# Patient Record
Sex: Female | Born: 1965 | Hispanic: Yes | Marital: Married
Health system: Southern US, Community
[De-identification: ages and names within clinical notes are randomized; demographics above are authoritative.]

---

## 2018-04-02 DIAGNOSIS — G8929 Other chronic pain: Secondary | ICD-10-CM | POA: Insufficient documentation

## 2018-04-03 DIAGNOSIS — R030 Elevated blood-pressure reading, without diagnosis of hypertension: Secondary | ICD-10-CM | POA: Insufficient documentation

## 2019-04-07 ENCOUNTER — Other Ambulatory Visit: Payer: Self-pay

## 2019-04-07 ENCOUNTER — Encounter (HOSPITAL_COMMUNITY): Payer: Self-pay | Admitting: Emergency Medicine

## 2019-04-07 ENCOUNTER — Emergency Department (HOSPITAL_COMMUNITY): Payer: BC Managed Care – PPO

## 2019-04-07 ENCOUNTER — Inpatient Hospital Stay (HOSPITAL_COMMUNITY)
Admission: EM | Admit: 2019-04-07 | Discharge: 2019-04-20 | DRG: 208 | Disposition: A | Discharge status: Home Health | Payer: BC Managed Care – PPO | Attending: Internal Medicine | Admitting: Internal Medicine

## 2019-04-07 DIAGNOSIS — Z79899 Other long term (current) drug therapy: Secondary | ICD-10-CM

## 2019-04-07 DIAGNOSIS — Z791 Long term (current) use of non-steroidal anti-inflammatories (NSAID): Secondary | ICD-10-CM

## 2019-04-07 DIAGNOSIS — J1289 Other viral pneumonia: Secondary | ICD-10-CM | POA: Diagnosis present

## 2019-04-07 DIAGNOSIS — J45909 Unspecified asthma, uncomplicated: Secondary | ICD-10-CM | POA: Diagnosis present

## 2019-04-07 DIAGNOSIS — R45851 Suicidal ideations: Secondary | ICD-10-CM | POA: Diagnosis present

## 2019-04-07 DIAGNOSIS — R0602 Shortness of breath: Secondary | ICD-10-CM | POA: Diagnosis present

## 2019-04-07 DIAGNOSIS — B9629 Other Escherichia coli [E. coli] as the cause of diseases classified elsewhere: Secondary | ICD-10-CM | POA: Diagnosis present

## 2019-04-07 DIAGNOSIS — R7303 Prediabetes: Secondary | ICD-10-CM | POA: Diagnosis present

## 2019-04-07 DIAGNOSIS — R739 Hyperglycemia, unspecified: Secondary | ICD-10-CM | POA: Diagnosis not present

## 2019-04-07 DIAGNOSIS — U071 COVID-19: Secondary | ICD-10-CM | POA: Diagnosis present

## 2019-04-07 DIAGNOSIS — J4520 Mild intermittent asthma, uncomplicated: Secondary | ICD-10-CM | POA: Diagnosis not present

## 2019-04-07 DIAGNOSIS — E870 Hyperosmolality and hypernatremia: Secondary | ICD-10-CM | POA: Diagnosis present

## 2019-04-07 DIAGNOSIS — Z978 Presence of other specified devices: Secondary | ICD-10-CM

## 2019-04-07 DIAGNOSIS — E739 Lactose intolerance, unspecified: Secondary | ICD-10-CM | POA: Diagnosis present

## 2019-04-07 DIAGNOSIS — N390 Urinary tract infection, site not specified: Secondary | ICD-10-CM | POA: Diagnosis present

## 2019-04-07 DIAGNOSIS — R440 Auditory hallucinations: Secondary | ICD-10-CM | POA: Diagnosis not present

## 2019-04-07 DIAGNOSIS — R0902 Hypoxemia: Secondary | ICD-10-CM

## 2019-04-07 DIAGNOSIS — J9621 Acute and chronic respiratory failure with hypoxia: Secondary | ICD-10-CM | POA: Diagnosis not present

## 2019-04-07 DIAGNOSIS — N179 Acute kidney failure, unspecified: Secondary | ICD-10-CM | POA: Diagnosis not present

## 2019-04-07 DIAGNOSIS — E876 Hypokalemia: Secondary | ICD-10-CM | POA: Diagnosis not present

## 2019-04-07 DIAGNOSIS — J80 Acute respiratory distress syndrome: Secondary | ICD-10-CM | POA: Diagnosis not present

## 2019-04-07 DIAGNOSIS — J9601 Acute respiratory failure with hypoxia: Secondary | ICD-10-CM | POA: Diagnosis not present

## 2019-04-07 DIAGNOSIS — G92 Toxic encephalopathy: Secondary | ICD-10-CM | POA: Diagnosis present

## 2019-04-07 DIAGNOSIS — J988 Other specified respiratory disorders: Secondary | ICD-10-CM

## 2019-04-07 DIAGNOSIS — G9340 Encephalopathy, unspecified: Secondary | ICD-10-CM | POA: Diagnosis not present

## 2019-04-07 DIAGNOSIS — E860 Dehydration: Secondary | ICD-10-CM | POA: Diagnosis not present

## 2019-04-07 DIAGNOSIS — R4182 Altered mental status, unspecified: Secondary | ICD-10-CM | POA: Diagnosis not present

## 2019-04-07 DIAGNOSIS — Z0189 Encounter for other specified special examinations: Secondary | ICD-10-CM

## 2019-04-07 DIAGNOSIS — F29 Unspecified psychosis not due to a substance or known physiological condition: Secondary | ICD-10-CM | POA: Diagnosis not present

## 2019-04-07 DIAGNOSIS — J069 Acute upper respiratory infection, unspecified: Secondary | ICD-10-CM | POA: Diagnosis not present

## 2019-04-07 LAB — CBC WITH DIFFERENTIAL/PLATELET
Abs Immature Granulocytes: 0.07 10*3/uL (ref 0.00–0.07)
Basophils Absolute: 0 10*3/uL (ref 0.0–0.1)
Basophils Relative: 0 %
Eosinophils Absolute: 0 10*3/uL (ref 0.0–0.5)
Eosinophils Relative: 0 %
HCT: 38.5 % (ref 36.0–46.0)
Hemoglobin: 12.9 g/dL (ref 12.0–15.0)
Immature Granulocytes: 1 %
Lymphocytes Relative: 6 %
Lymphs Abs: 0.5 10*3/uL — ABNORMAL LOW (ref 0.7–4.0)
MCH: 31.3 pg (ref 26.0–34.0)
MCHC: 33.5 g/dL (ref 30.0–36.0)
MCV: 93.4 fL (ref 80.0–100.0)
Monocytes Absolute: 0.2 10*3/uL (ref 0.1–1.0)
Monocytes Relative: 2 %
Neutro Abs: 7.1 10*3/uL (ref 1.7–7.7)
Neutrophils Relative %: 91 %
Platelets: 251 10*3/uL (ref 150–400)
RBC: 4.12 MIL/uL (ref 3.87–5.11)
RDW: 12.7 % (ref 11.5–15.5)
WBC: 7.8 10*3/uL (ref 4.0–10.5)
nRBC: 0 % (ref 0.0–0.2)

## 2019-04-07 LAB — COMPREHENSIVE METABOLIC PANEL
ALT: 40 U/L (ref 0–44)
AST: 50 U/L — ABNORMAL HIGH (ref 15–41)
Albumin: 3.4 g/dL — ABNORMAL LOW (ref 3.5–5.0)
Alkaline Phosphatase: 70 U/L (ref 38–126)
Anion gap: 8 (ref 5–15)
BUN: 6 mg/dL (ref 6–20)
CO2: 24 mmol/L (ref 22–32)
Calcium: 8.6 mg/dL — ABNORMAL LOW (ref 8.9–10.3)
Chloride: 105 mmol/L (ref 98–111)
Creatinine, Ser: 0.59 mg/dL (ref 0.44–1.00)
GFR calc Af Amer: >60 mL/min (ref >60)
GFR calc non Af Amer: >60 mL/min (ref >60)
Glucose, Bld: 141 mg/dL — ABNORMAL HIGH (ref 70–99)
Potassium: 3.2 mmol/L — ABNORMAL LOW (ref 3.5–5.1)
Sodium: 137 mmol/L (ref 135–145)
Total Bilirubin: 0.5 mg/dL (ref 0.3–1.2)
Total Protein: 7.4 g/dL (ref 6.5–8.1)

## 2019-04-07 LAB — D-DIMER, QUANTITATIVE: D-Dimer, Quant: 0.55 ug/mL-FEU — ABNORMAL HIGH (ref 0.00–0.50)

## 2019-04-07 LAB — LACTIC ACID, PLASMA: Lactic Acid, Venous: 1.3 mmol/L (ref 0.5–1.9)

## 2019-04-07 LAB — FERRITIN: Ferritin: 568 ng/mL — ABNORMAL HIGH (ref 11–307)

## 2019-04-07 LAB — SARS CORONAVIRUS 2 BY RT PCR (HOSPITAL ORDER, PERFORMED IN ~~LOC~~ HOSPITAL LAB): SARS Coronavirus 2: POSITIVE — AB

## 2019-04-07 LAB — TRIGLYCERIDES: Triglycerides: 91 mg/dL (ref <150)

## 2019-04-07 LAB — FIBRINOGEN: Fibrinogen: 619 mg/dL — ABNORMAL HIGH (ref 210–475)

## 2019-04-07 LAB — BLOOD GAS, VENOUS

## 2019-04-07 LAB — PROCALCITONIN: Procalcitonin: <0.1 ng/mL

## 2019-04-07 LAB — LACTATE DEHYDROGENASE: LDH: 332 U/L — ABNORMAL HIGH (ref 98–192)

## 2019-04-07 LAB — C-REACTIVE PROTEIN: CRP: 18.5 mg/dL — ABNORMAL HIGH (ref <1.0)

## 2019-04-07 MED ORDER — METHYLPREDNISOLONE SODIUM SUCC 40 MG IJ SOLR
40.0000 mg | Freq: Three times a day (TID) | INTRAMUSCULAR | Status: DC
  Administered 2019-04-07 – 2019-04-11 (×12): 40 mg via INTRAVENOUS
  Filled 2019-04-07 (×14): qty 1

## 2019-04-07 MED ORDER — ONDANSETRON HCL 4 MG/2ML IJ SOLN: 4.0000 mg | Freq: Four times a day (QID) | INTRAMUSCULAR | Status: DC | PRN

## 2019-04-07 MED ORDER — GUAIFENESIN-DM 100-10 MG/5ML PO SYRP
10.0000 mL | ORAL_SOLUTION | ORAL | Status: DC | PRN
  Administered 2019-04-07 – 2019-04-19 (×16): 10 mL via ORAL
  Filled 2019-04-07 (×16): qty 10

## 2019-04-07 MED ORDER — ONDANSETRON HCL 4 MG PO TABS: 4.0000 mg | ORAL_TABLET | Freq: Four times a day (QID) | ORAL | Status: DC | PRN

## 2019-04-07 MED ORDER — ENOXAPARIN SODIUM 40 MG/0.4ML ~~LOC~~ SOLN
40.0000 mg | SUBCUTANEOUS | Status: DC
  Administered 2019-04-07: 40 mg via SUBCUTANEOUS
  Filled 2019-04-07: qty 0.4

## 2019-04-07 MED ORDER — POLYETHYLENE GLYCOL 3350 17 G PO PACK
17.0000 g | PACK | Freq: Every day | ORAL | Status: DC | PRN
  Administered 2019-04-12: 17 g via ORAL
  Filled 2019-04-07: qty 1

## 2019-04-07 MED ORDER — POTASSIUM CHLORIDE CRYS ER 20 MEQ PO TBCR
40.0000 meq | EXTENDED_RELEASE_TABLET | Freq: Once | ORAL | Status: AC
  Administered 2019-04-07: 40 meq via ORAL
  Filled 2019-04-07: qty 2

## 2019-04-07 MED ORDER — SODIUM CHLORIDE 0.9 % IV SOLN
200.0000 mg | Freq: Once | INTRAVENOUS | Status: AC
  Administered 2019-04-08: 200 mg via INTRAVENOUS
  Filled 2019-04-07: qty 40

## 2019-04-07 MED ORDER — SODIUM CHLORIDE 0.9 % IV SOLN
100.0000 mg | INTRAVENOUS | Status: AC
  Administered 2019-04-08 – 2019-04-11 (×4): 100 mg via INTRAVENOUS
  Filled 2019-04-07 (×4): qty 20

## 2019-04-07 MED ORDER — IPRATROPIUM-ALBUTEROL 20-100 MCG/ACT IN AERS
1.0000 | INHALATION_SPRAY | Freq: Four times a day (QID) | RESPIRATORY_TRACT | Status: DC
  Administered 2019-04-08 (×2): 1 via RESPIRATORY_TRACT
  Filled 2019-04-07: qty 4

## 2019-04-07 MED ORDER — HYDROCODONE-ACETAMINOPHEN 5-325 MG PO TABS
1.0000 | ORAL_TABLET | ORAL | Status: DC | PRN
  Filled 2019-04-07: qty 2

## 2019-04-07 MED ORDER — SODIUM CHLORIDE 0.9 % IV SOLN
INTRAVENOUS | Status: DC
  Administered 2019-04-07: 15:00:00 via INTRAVENOUS

## 2019-04-07 MED ORDER — ZINC SULFATE 220 (50 ZN) MG PO CAPS
220.0000 mg | ORAL_CAPSULE | Freq: Every day | ORAL | Status: DC
  Administered 2019-04-07 – 2019-04-20 (×13): 220 mg via ORAL
  Filled 2019-04-07 (×14): qty 1

## 2019-04-07 MED ORDER — ACETAMINOPHEN 325 MG PO TABS
650.0000 mg | ORAL_TABLET | Freq: Once | ORAL | Status: AC
  Administered 2019-04-07: 650 mg via ORAL
  Filled 2019-04-07: qty 2

## 2019-04-07 MED ORDER — ACETAMINOPHEN 325 MG PO TABS
650.0000 mg | ORAL_TABLET | Freq: Four times a day (QID) | ORAL | Status: DC | PRN
  Administered 2019-04-12 – 2019-04-19 (×6): 650 mg via ORAL
  Filled 2019-04-07 (×6): qty 2

## 2019-04-07 MED ORDER — VITAMIN C 500 MG PO TABS
500.0000 mg | ORAL_TABLET | Freq: Every day | ORAL | Status: DC
  Administered 2019-04-07 – 2019-04-20 (×13): 500 mg via ORAL
  Filled 2019-04-07 (×14): qty 1

## 2019-04-07 MED ORDER — ALBUTEROL SULFATE HFA 108 (90 BASE) MCG/ACT IN AERS
6.0000 | INHALATION_SPRAY | Freq: Once | RESPIRATORY_TRACT | Status: AC
  Administered 2019-04-07: 6 via RESPIRATORY_TRACT
  Filled 2019-04-07: qty 6.7

## 2019-04-07 MED ORDER — SODIUM CHLORIDE 0.9 % IV SOLN
INTRAVENOUS | Status: DC
  Administered 2019-04-07 – 2019-04-08 (×2): via INTRAVENOUS

## 2019-04-07 NOTE — ED Triage Notes (Signed)
Patient came in by self from home. Pt c/o SOB that started last Friday. SOB has became worst since last Friday. Pt also c/o cough and pain on chest.   Hx of asthma.   Pt had positive COVID-19 test that was done at another hospital.

## 2019-04-07 NOTE — Progress Notes (Signed)
Joyce Mcguire NVB:[id number] DOB: May 18, 1966 DOA: 04/07/2019 PCP: System, Pcp Not In   Subj: 53 y.o. HF PMHx  Asthma.   On chart review, patient was seen 5 days ago at an outside ED and was found to be COVID-19 positive. At that time she was having cough, fevers and chills with no known COVID-19 exposures. She presents today with worsening dyspnea.  ED Course: Vitals: Afebrile, Pulse of 99, RR of 24-36, BP 131/77, SpO2 of 96 on 4 L via [place]  Negative history of hepatitis, TB, cancer, recent use of immunomodulators   Obj: Objective: VITAL SIGNS: Temp: 99.6 F (37.6 C) (06/01 2003) Temp Source: Oral (06/01 2003) BP: 132/81 (06/01 2003) Pulse Rate: 102 (06/01 2003) SPO2; FIO2:  No intake or output data in the 24 hours ending 04/07/19 2301   Exam: General: A/O x4, positive acute respiratory distress Lungs: Tachypneic, diffuse decreased breath sounds, can only speak in 4-5 word sentences, without wheezes or crackles Cardiovascular: Tachycardic, without murmur gallop or rub normal S1 and S2 Abdomen: Nontender, nondistended, soft, bowel sounds positive, no rebound, no ascites, no appreciable mass Extremities: No significant cyanosis, clubbing, or edema bilateral lower extremities Skin: Negative rashes, lesions, ulcers Psychiatric:  Negative depression, negative anxiety, negative fatigue, negative mania  Central nervous system:  Cranial nerves II through XII intact, tongue/uvula midline, all extremities muscle strength 5/5, sensation intact throughout, negative dysarthria, negative expressive aphasia, negative receptive aphasia.  .     Procedure/Significant Events: 6/1 PCXR: Bilateral diffuse patchy infiltrates    I have personally reviewed and interpreted all radiology studies and my findings are as above.   Culture 6/1 SARS coronavirus positive 6/1 blood pending 6/1 respiratory virus panel pending 6/1 sputum pending    Antibiotics: Anti-infectives (From admission,  onward)   Start     Ordered Stop   04/08/19 2345  remdesivir 100 mg in sodium chloride 0.9 % 230 mL IVPB     04/07/19 2330     04/07/19 2345  remdesivir 200 mg in sodium chloride 0.9 % 210 mL IVPB     04/07/19 2330 04/08/19 0037        Active Problems:   COVID-19 virus infection   Asthma   A/P  Acute acute respiratory failure with hypoxia/COVID 19 pneumonia - PCXR consistent with COVID 19 infection, significant new O2 demand, CRP> 7 - Solu-Medrol 40 mg 3 times daily -Remdesivir per pharmacy - Actemra 8 mg/kilogram x1 dose may repeat in 12 hours - Combivent bivent every 6 hours -Flutter valve - Nursing ensure patient groaning 2 to 3 hours every 12 hours minimum -COVID daily labs pending  Asthma -Asthma covered by medication for COVID 19 pneumonia     Care during the described time interval was provided by me .  I have reviewed this patient's available data, including medical history, events of note, physical examination, and all test results as part of my evaluation.   Time spent 30 minutes

## 2019-04-07 NOTE — ED Notes (Signed)
[facility] at bedside for transport to facility. Patient A&O x4 and stable upon transport.

## 2019-04-07 NOTE — ED Notes (Signed)
ED TO INPATIENT HANDOFF REPORT  ED Nurse Name and Phone #: Alvester Chou [phone]  S Name/Age/Gender Joyce Mcguire 53 y.o. female Room/Bed: WA18/WA18  Code Status   Code Status: Not on file  Home/SNF/Other Home Patient oriented to: self, place, time and situation Is this baseline? Yes   Triage Complete: Triage complete  Chief Complaint covid-19 pos, Short of breath   Triage Note Patient came in by self from home. Pt c/o SOB that started last Friday. SOB has became worst since last Friday. Pt also c/o cough and pain on chest.   Hx of asthma.   Pt had positive COVID-19 test that was done at another hospital.    Allergies Not on File  Level of Care/Admitting Diagnosis ED Disposition    ED Disposition Condition Comment   Admit  Hospital Area[facility] [[id number]]  Level of Care: Progressive [102]  Covid Evaluation: Confirmed COVID Positive  Isolation Risk Level: Low Risk/Droplet (Less than 4L [place] supplementation)  Diagnosis: COVID-19 virus infection [[id number]]  Admitting Physician: Narda Bonds [phone]  Attending Physician: Narda Bonds [phone]  Estimated length of stay: past midnight tomorrow  Certification:: I certify this patient will need inpatient services for at least 2 midnights  PT Class (Do Not Modify): Inpatient [101]  PT Acc Code (Do Not Modify): Private [1]       B Medical/Surgery History History reviewed. No pertinent past medical history. History reviewed. No pertinent surgical history.   A IV Location/Drains/Wounds Patient Lines/Drains/Airways Status   Active Line/Drains/Airways    Name:   Placement date:   Placement time:   Site:   Days:   Peripheral IV 04/07/19 Left Hand   04/07/19    1436    Hand   less than 1   Peripheral IV 04/07/19 Right Hand   04/07/19    1449    Hand   less than 1          Intake/Output Last 24 hours No intake or output data in the 24 hours ending 04/07/19 1838  Labs/Imaging Results for  orders placed or performed during the hospital encounter of 04/07/19 (from the past 48 hour(s))  Comprehensive metabolic panel     Status: Abnormal   Collection Time: 04/07/19  2:52 PM  Result Value Ref Range   Sodium 137 135 - 145 mmol/L   Potassium 3.2 (L) 3.5 - 5.1 mmol/L   Chloride 105 98 - 111 mmol/L   CO2 24 22 - 32 mmol/L   Glucose, Bld 141 (H) 70 - 99 mg/dL   BUN 6 6 - 20 mg/dL   Creatinine, Ser 1.10 0.44 - 1.00 mg/dL   Calcium 8.6 (L) 8.9 - 10.3 mg/dL   Total Protein 7.4 6.5 - 8.1 g/dL   Albumin 3.4 (L) 3.5 - 5.0 g/dL   AST 50 (H) 15 - 41 U/L   ALT 40 0 - 44 U/L   Alkaline Phosphatase 70 38 - 126 U/L   Total Bilirubin 0.5 0.3 - 1.2 mg/dL   GFR calc non Af Amer >60 >60 mL/min   GFR calc Af Amer >60 >60 mL/min   Anion gap 8 5 - 15    Comment: Performed at [facility], [address], [place], [place] [address]  CBC with Differential     Status: Abnormal   Collection Time: 04/07/19  2:52 PM  Result Value Ref Range   WBC 7.8 4.0 - 10.5 K/uL   RBC 4.12 3.87 - 5.11  MIL/uL   Hemoglobin 12.9 12.0 - 15.0 g/dL   HCT 16.1 09.6 - 04.5 %   MCV 93.4 80.0 - 100.0 fL   MCH 31.3 26.0 - 34.0 pg   MCHC 33.5 30.0 - 36.0 g/dL   RDW 40.9 81.1 - 91.4 %   Platelets 251 150 - 400 K/uL   nRBC 0.0 0.0 - 0.2 %   Neutrophils Relative % 91 %   Neutro Abs 7.1 1.7 - 7.7 K/uL   Lymphocytes Relative 6 %   Lymphs Abs 0.5 (L) 0.7 - 4.0 K/uL   Monocytes Relative 2 %   Monocytes Absolute 0.2 0.1 - 1.0 K/uL   Eosinophils Relative 0 %   Eosinophils Absolute 0.0 0.0 - 0.5 K/uL   Basophils Relative 0 %   Basophils Absolute 0.0 0.0 - 0.1 K/uL   WBC Morphology MORPHOLOGY UNREMARKABLE    Immature Granulocytes 1 %   Abs Immature Granulocytes 0.07 0.00 - 0.07 K/uL    Comment: Performed at [facility], [address] [place], [place] [address]  SARS Coronavirus 2 (CEPHEID- Performed in [facility] hospital lab), Hosp Order     Status: Abnormal   Collection Time:  04/07/19  2:53 PM  Result Value Ref Range   SARS Coronavirus 2 POSITIVE (A) NEGATIVE    Comment: RESULT CALLED TO, READ BACK BY AND VERIFIED WITH: Pablo Ledger [id number] @ 1700 BY J SCOTTON (NOTE) If result is NEGATIVE SARS-CoV-2 target nucleic acids are NOT DETECTED. The SARS-CoV-2 RNA is generally detectable in upper and lower  respiratory specimens during the acute phase of infection. The lowest  concentration of SARS-CoV-2 viral copies this assay can detect is 250  copies / mL. A negative result does not preclude SARS-CoV-2 infection  and should not be used as the sole basis for treatment or other  patient management decisions.  A negative result may occur with  improper specimen collection / handling, submission of specimen other  than nasopharyngeal swab, presence of viral mutation(s) within the  areas targeted by this assay, and inadequate number of viral copies  (<250 copies / mL). A negative result must be combined with clinical  observations, patient history, and epidemiological information. If result is POSITIVE SARS-CoV-2 target nucleic acids are DETEC TED. The SARS-CoV-2 RNA is generally detectable in upper and lower  respiratory specimens during the acute phase of infection.  Positive  results are indicative of active infection with SARS-CoV-2.  Clinical  correlation with patient history and other diagnostic information is  necessary to determine patient infection status.  Positive results do  not rule out bacterial infection or co-infection with other viruses. If result is PRESUMPTIVE POSTIVE SARS-CoV-2 nucleic acids MAY BE PRESENT.   A presumptive positive result was obtained on the submitted specimen  and confirmed on repeat testing.  While 2019 novel coronavirus  (SARS-CoV-2) nucleic acids may be present in the submitted sample  additional confirmatory testing may be necessary for epidemiological  and / or clinical management purposes  to differentiate between   SARS-CoV-2 and other Sarbecovirus currently known to infect humans.  If clinically indicated additional testing with an alternate test  methodology (LAB7 453) is advised. The SARS-CoV-2 RNA is generally  detectable in upper and lower respiratory specimens during the acute  phase of infection. The expected result is Negative. Fact Sheet for Patients:  BoilerBrush.com.cy Fact Sheet for Healthcare Providers: https://pope.com/ This test is not yet approved or cleared by the [place] FDA and has been authorized for  detection and/or diagnosis of SARS-CoV-2 by FDA under an Emergency Use Authorization (EUA).  This EUA will remain in effect (meaning this test can be used) for the duration of the COVID-19 declaration under Section 564(b)(1) of the Act, 21 U.S.C. section 360bbb-3(b)(1), unless the authorization is terminated or revoked sooner. Performed at [facility], [address] [place], [place] [address]   Blood gas, venous     Status: Abnormal (Preliminary result)   Collection Time: 04/07/19  3:20 PM  Result Value Ref Range   pH, Ven 7.543 (H) 7.250 - 7.430   pCO2, Ven 30.2 (L) 44.0 - 60.0 mmHg   pO2, Ven PENDING 32.0 - 45.0 mmHg   Bicarbonate 25.9 20.0 - 28.0 mmol/L   O2 Saturation  %    CRITICAL RESULT CALLED TO, READ BACK BY AND VERIFIED WITH:    Comment: DR Effie ShyWENTZ BY ROBIN POWELL RRT ON 04/07/19 AT 1522   Patient temperature 98.6    Collection site DRAWN BY RN    Drawn by [phone]    Sample type VENOUS     Comment: Performed at [facility], [address] [place], [place] [address]  Lactic acid, plasma     Status: None   Collection Time: 04/07/19  4:21 PM  Result Value Ref Range   Lactic Acid, Venous 1.3 0.5 - 1.9 mmol/L    Comment: Performed at [facility], [address] [place], [place] [address]  D-dimer, quantitative     Status: Abnormal   Collection Time:  04/07/19  4:32 PM  Result Value Ref Range   D-Dimer, Quant 0.55 (H) 0.00 - 0.50 ug/mL-FEU    Comment: (NOTE) At the manufacturer cut-off of 0.50 ug/mL FEU, this assay has been documented to exclude PE with a sensitivity and negative predictive value of 97 to 99%.  At this time, this assay has not been approved by the FDA to exclude DVT/VTE. Results should be correlated with clinical presentation. Performed at [facility], [address] [place], [place] [address]   Triglycerides     Status: None   Collection Time: 04/07/19  4:32 PM  Result Value Ref Range   Triglycerides 91 <150 mg/dL    Comment: Performed at [facility], [address] [place], [place] [address]  Fibrinogen     Status: Abnormal   Collection Time: 04/07/19  4:32 PM  Result Value Ref Range   Fibrinogen 619 (H) 210 - 475 mg/dL    Comment: Performed at [facility], [address] [place], [place] [address]  C-reactive protein     Status: Abnormal   Collection Time: 04/07/19  4:32 PM  Result Value Ref Range   CRP 18.5 (H) <1.0 mg/dL    Comment: Performed at [facility], [address] [place], [place] [address]   Dg Chest Port 1 View  Result Date: 04/07/2019 CLINICAL DATA:  Shortness of breath since last Friday, cough, chest pain, history asthma, reported COVID-19 positive test at another hospital EXAM: PORTABLE CHEST 1 VIEW COMPARISON:  Portable exam 1455 hours without priors for comparison FINDINGS: Upper normal heart size. Normal mediastinal contours. Patchy BILATERAL pulmonary infiltrates likely infectious. No definite pleural effusion or pneumothorax. Osseous structures unremarkable. IMPRESSION: Patchy BILATERAL pulmonary infiltrates consistent with pneumonia, including atypical etiologies. Electronically Signed   By: Ulyses SouthwardMark  Boles M.D.   On: 04/07/2019 15:34    Pending Labs [employer] Labs (From admission, onward)    Start  Ordered   04/07/19 1632  Lactate dehydrogenase  Once,   STAT     04/07/19 1631   04/07/19 1632  Ferritin  Once,   STAT     04/07/19 1631   04/07/19 1621  Urinalysis, Routine w reflex microscopic  Once,   R     04/07/19 1620   04/07/19 1621  Lactic acid, plasma  Now then every 2 hours,   STAT     04/07/19 1620   04/07/19 1621  Urine culture  ONCE - STAT,   STAT     04/07/19 1620   04/07/19 1621  Blood culture (routine x 2)  BLOOD CULTURE X 2,   STAT     04/07/19 1620   04/07/19 1500  Procalcitonin  Once,   R     04/07/19 1500   Signed and Held  HIV antibody (Routine Testing)  Tomorrow morning,   R     Signed and Held   Signed and Held  ABO/Rh  Once,   R     Signed and Held   Signed and Held  Creatinine, serum  (enoxaparin (LOVENOX)    CrCl >/= 30 ml/min)  Weekly,   R    Comments:  while on enoxaparin therapy    Signed and Held   Signed and Held  C-reactive protein  Daily,   R     Signed and Held   Signed and Held  D-dimer, quantitative (not at [facility])  Daily,   R     Signed and Held   Signed and Held  Ferritin  Daily,   R     Signed and Held   Signed and Held  CBC with Differential/Platelet  Daily,   R     Signed and Held   Signed and Held  Comprehensive metabolic panel  Daily,   R     Signed and Held          Vitals/Pain Today's Vitals   04/07/19 1715 04/07/19 1745 04/07/19 1759 04/07/19 1815  BP: (!) 143/87 131/77  118/63  Pulse: 99 99  96  Resp: (!) 36 (!) 27  (!) 33  Temp:  99.4 F (37.4 C)    TempSrc:  Oral    SpO2: 92% 96%  97%  Weight:      Height:      PainSc:   10-Worst pain ever     Isolation Precautions Droplet and Contact precautions  Medications Medications  0.9 %  sodium chloride infusion ( Intravenous New Bag/Given 04/07/19 1455)  methylPREDNISolone sodium succinate (SOLU-MEDROL) 40 mg/mL injection 40 mg (40 mg Intravenous Given 04/07/19 1756)  potassium chloride SA (K-DUR) CR tablet 40 mEq (has no administration in time range)  acetaminophen  (TYLENOL) tablet 650 mg (650 mg Oral Given 04/07/19 1712)  albuterol (VENTOLIN HFA) 108 (90 Base) MCG/ACT inhaler 6 puff (6 puffs Inhalation Given 04/07/19 1711)    Mobility walks with person assist Low fall risk   Focused Assessments Pulmonary Assessment Handoff:  Lung sounds: Bilateral Breath Sounds: Clear L Breath Sounds: Clear R Breath Sounds: Clear O2 Device: Nasal Cannula O2 Flow Rate (L/min): 4 L/min      R Recommendations: See Admitting Provider Note  Report given to:   Additional Notes:

## 2019-04-07 NOTE — ED Provider Notes (Signed)
Patient with history of asthma, recent positive for coronavirus about 4 days ago.  Has had cough, shortness of breath for the last week.  Patient continued to have symptoms today.  She has low-grade fever, tachypnea.  New hypoxia.  On 4 L of oxygen.  Feels better on oxygen.  Chest x-ray showing consistent finding of likely inflammation from COVID.  Will admit to hospital for further care given hypoxia and work of breathing.  Patient hemodynamically stable otherwise.  Blood gas reassuring.  No significant anemia, leukocytosis.  This chart was dictated using voice recognition software.  Despite best efforts to proofread,  errors can occur which can change the documentation meaning.     Virgina Norfolk, DO 04/07/19 1624

## 2019-04-07 NOTE — Progress Notes (Signed)
Updated Daughter-in-law [name] via phone. [name] would like to be primary contact for this patient. Patient agreeable to this. Will add information to patient's chart. Answered family's questions and informed that they will be updated on patient's condition during this admission.

## 2019-04-07 NOTE — ED Notes (Signed)
Patients daughter, Ezequiel Essex, states patient has had a bad cough, cold triggers the cough, and decrease appetite. This morning she had a mid sternal chest pain with warmth feeling and cold feeling in her back. Also, states patient felt like her throat was closing up on her today and having trouble breathing. Daughter denies that patient has experienced a fever since Friday. A few days ago she was vomiting that she is aware of. Reports she is very weak.

## 2019-04-07 NOTE — ED Provider Notes (Signed)
[facility]-EMERGENCY DEPT Provider Note   CSN: [id number] Arrival date & time: 04/07/19  1351    History   Chief Complaint Chief Complaint  Patient presents with  . Shortness of Breath    HPI Joyce Mcguire is a 53 y.o. female.     HPI  Patient presents for evaluation of shortness of breath.  She reportedly had a COVID positive test done "at another hospital."  Level 5 caveat-acuity of condition   History reviewed. No pertinent past medical history.  There are no active problems to display for this patient.   History reviewed. No pertinent surgical history.   OB History   No obstetric history on file.      Home Medications    Prior to Admission medications   Not on File    Family History No family history on file.  Social History Social History   Tobacco Use  . Smoking status: Not on file  Substance Use Topics  . Alcohol use: Not on file  . Drug use: Not on file     Allergies   Patient has no allergy information on record.   Review of Systems Review of Systems  Unable to perform ROS: Acuity of condition     Physical Exam Updated Vital Signs BP 136/89 (BP Location: Left Arm)   Pulse (!) 102   Temp 99.6 F (37.6 C) (Oral)   Resp (!) 30   Ht 5' (1.524 m)   Wt 68 kg   SpO2 96%   BMI 29.29 kg/m   Physical Exam Vitals signs and nursing note reviewed.  Constitutional:      General: She is not in acute distress.    Appearance: She is well-developed. She is not ill-appearing, toxic-appearing or diaphoretic.  HENT:     Head: Normocephalic and atraumatic.     Right Ear: External ear normal.     Left Ear: External ear normal.  Eyes:     Conjunctiva/sclera: Conjunctivae normal.     Pupils: Pupils are equal, round, and reactive to light.  Neck:     Musculoskeletal: Normal range of motion and neck supple. No neck rigidity.     Trachea: Phonation normal.  Cardiovascular:     Rate and Rhythm: Regular rhythm.  Tachycardia present.     Heart sounds: Normal heart sounds.  Pulmonary:     Effort: Pulmonary effort is normal. No respiratory distress.     Breath sounds: No stridor. No rhonchi.  Chest:     Chest wall: No tenderness.  Abdominal:     General: There is no distension.     Palpations: Abdomen is soft.     Tenderness: There is no abdominal tenderness. There is no guarding.  Musculoskeletal: Normal range of motion.        General: No swelling.     Right lower leg: No edema.     Left lower leg: No edema.  Skin:    General: Skin is warm and dry.  Neurological:     Mental Status: She is alert.     Cranial Nerves: No cranial nerve deficit.     Sensory: No sensory deficit.     Motor: No abnormal muscle tone.     Coordination: Coordination normal.  Psychiatric:        Mood and Affect: Mood normal.        Behavior: Behavior normal.      ED Treatments / Results  Labs (all labs ordered are listed, but only abnormal  results are displayed) Labs Reviewed  SARS CORONAVIRUS 2 (HOSPITAL ORDER, PERFORMED IN [facility])  COMPREHENSIVE METABOLIC PANEL  CBC WITH DIFFERENTIAL/PLATELET  BLOOD GAS, VENOUS    EKG EKG Interpretation  Date/Time:  Monday April 07 2019 14:16:03 EDT Ventricular Rate:  105 PR Interval:    QRS Duration: 81 QT Interval:  328 QTC Calculation: 434 R Axis:   33 Text Interpretation:  Sinus tachycardia No old tracing to compare Confirmed by Mancel Bale [phone]) on 04/07/2019 2:25:44 PM   Radiology No results found.  Procedures .Critical Care Performed by: Mancel Bale, MD Authorized by: Mancel Bale, MD   Critical care provider statement:    Critical care time (minutes):  35   Critical care start time:  04/07/2019 2:30 PM   Critical care end time:  04/07/2019 3:05 PM   Critical care time was exclusive of:  Separately billable procedures and treating other patients   Critical care was necessary to treat or prevent imminent or life-threatening  deterioration of the following conditions:  Respiratory failure   Critical care was time spent personally by me on the following activities:  Blood draw for specimens, development of treatment plan with patient or surrogate, discussions with consultants, evaluation of patient's response to treatment, examination of patient, obtaining history from patient or surrogate, ordering and performing treatments and interventions, ordering and review of laboratory studies, pulse oximetry, re-evaluation of patient's condition, review of old charts and ordering and review of radiographic studies   (including critical care time)  Medications Ordered in ED Medications  0.9 %  sodium chloride infusion ( Intravenous New Bag/Given 04/07/19 1455)     Initial Impression / Assessment and Plan / ED Course  I have reviewed the triage vital signs and the nursing notes.  Pertinent labs & imaging results that were available during my care of the patient were reviewed by me and considered in my medical decision making (see chart for details).  Clinical Course as of Apr 06 1456  Mon Apr 07, 2019  1455 Patient arrived hypoxic, in respiratory distress according to nursing.  Reportedly oxygen saturations were 88% on room air.  She was placed initially on 2 L nasal cannula oxygen then 4 L, with improvement of saturations to 96 to 98%.  At that point her respiratory distress resolved.   [EW]    Clinical Course User Index [EW] Mancel Bale, MD        Patient Vitals for the past 24 hrs:  BP Temp Temp src Pulse Resp SpO2 Height Weight  04/07/19 1425 - - - - - - 5' (1.524 m) 68 kg  04/07/19 1423 136/89 99.6 F (37.6 C) Oral (!) 102 (!) 30 96 % - -     Medical Decision Making: Acute respiratory illness, likely COVID-19.  Patient is hypoxic, and normalized, on nasal cannula oxygen.  Joyce Mcguire was evaluated in Emergency Department on 04/07/2019 for the symptoms described in the history of present illness. She was  evaluated in the context of the global COVID-19 pandemic, which necessitated consideration that the patient might be at risk for infection with the SARS-CoV-2 virus that causes COVID-19. Institutional protocols and algorithms that pertain to the evaluation of patients at risk for COVID-19 are in a state of rapid change based on information released by regulatory bodies including the CDC and federal and state organizations. These policies and algorithms were followed during the patient's care in the ED.   CRITICAL CARE-yes Performed by: Mancel Bale  Nursing Notes  Reviewed/ Care Coordinated Applicable Imaging Reviewed Interpretation of Laboratory Data incorporated into ED treatment  Care to oncoming provider team to evaluate post evaluation, and consider disposition  Final Clinical Impressions(s) / ED Diagnoses   Final diagnoses:  Respiratory infection  Hypoxia    ED Discharge Orders    None       Mancel Bale, MD 04/07/19 1458

## 2019-04-07 NOTE — ED Notes (Signed)
Bed: JG81 Expected date:  Expected time:  Means of arrival:  Comments: evs to zap @1 :45

## 2019-04-07 NOTE — H&P (Addendum)
History and Physical    Laveda Hupp YWV:[id number] DOB: 02/14/1966 DOA: 04/07/2019  PCP: System, Pcp Not In  Patient coming from: Home  Chief Complaint: Shortness of breath  HPI: Joyce Mcguire is a 53 y.o. female with medical history significant of Asthma. Unable to obtain history secondary to patient's shortness of breath. She is able to answer my questions, however, but does not want to spend energy discussing her HPI. On chart review, patient was seen 5 days ago at an outside ED and was found to be COVID-19 positive. At that time she was having cough, fevers and chills with no known COVID-19 exposures. She presents today with worsening dyspnea.  ED Course: Vitals: Afebrile, Pulse of 99, RR of 24-36, BP 131/77, SpO2 of 96 on 4 L via [place] Labs: Potassium Imaging: Chest  Medications/Course: Albuterol, Tylenol, NS  Review of Systems: Review of Systems  Unable to perform ROS: Severe respiratory distress    History reviewed. No pertinent past medical history.  History reviewed. No pertinent surgical history.   has no history on file for tobacco, alcohol, and drug.  Not on File  History reviewed. No pertinent family history.  Prior to Admission medications   Not on File    Physical Exam:  Physical Exam Vitals signs and nursing note reviewed.  Constitutional:      General: She is not in acute distress.    Appearance: She is well-developed. She is not diaphoretic.  Eyes:     Conjunctiva/sclera: Conjunctivae normal.     Pupils: Pupils are equal, round, and reactive to light.  Neck:     Musculoskeletal: Normal range of motion.  Cardiovascular:     Rate and Rhythm: Normal rate and regular rhythm.     Heart sounds: Normal heart sounds. No murmur.  Pulmonary:     Effort: Tachypnea, accessory muscle usage and respiratory distress present.     Breath sounds: Decreased breath sounds present. No wheezing or rales.  Abdominal:     General: Bowel sounds are normal. There is no  distension.     Palpations: Abdomen is soft.     Tenderness: There is no abdominal tenderness. There is no guarding or rebound.  Musculoskeletal: Normal range of motion.        General: No tenderness.     Right lower leg: No edema.     Left lower leg: No edema.  Lymphadenopathy:     Cervical: No cervical adenopathy.  Skin:    General: Skin is warm and dry.  Neurological:     Mental Status: She is alert and oriented to person, place, and time.  Psychiatric:        Mood and Affect: Mood is anxious.     Labs on Admission: I have personally reviewed following labs and imaging studies  CBC: Recent Labs  Lab 04/07/19 1452  WBC 7.8  NEUTROABS 7.1  HGB 12.9  HCT 38.5  MCV 93.4  PLT 251    Basic Metabolic Panel: Recent Labs  Lab 04/07/19 1452  NA 137  K 3.2*  CL 105  CO2 24  GLUCOSE 141*  BUN 6  CREATININE 0.59  CALCIUM 8.6*    GFR: Estimated Creatinine Clearance: 69.2 mL/min (by C-G formula based on SCr of 0.59 mg/dL).  Liver Function Tests: Recent Labs  Lab 04/07/19 1452  AST 50*  ALT 40  ALKPHOS 70  BILITOT 0.5  PROT 7.4  ALBUMIN 3.4*   No results for input(s): LIPASE, AMYLASE in the last 168 hours.  No results for input(s): AMMONIA in the last 168 hours.  Coagulation Profile: No results for input(s): INR, PROTIME in the last 168 hours.  Cardiac Enzymes: No results for input(s): CKTOTAL, CKMB, CKMBINDEX, TROPONINI in the last 168 hours.  BNP (last 3 results) No results for input(s): PROBNP in the last 8760 hours.  HbA1C: No results for input(s): HGBA1C in the last 72 hours.  CBG: No results for input(s): GLUCAP in the last 168 hours.  Lipid Profile: No results for input(s): CHOL, HDL, LDLCALC, TRIG, CHOLHDL, LDLDIRECT in the last 72 hours.  Thyroid Function Tests: No results for input(s): TSH, T4TOTAL, FREET4, T3FREE, THYROIDAB in the last 72 hours.  Anemia Panel: No results for input(s): VITAMINB12, FOLATE, FERRITIN, TIBC, IRON,  RETICCTPCT in the last 72 hours.  Urine analysis: No results found for: COLORURINE, APPEARANCEUR, LABSPEC, PHURINE, GLUCOSEU, HGBUR, BILIRUBINUR, KETONESUR, PROTEINUR, UROBILINOGEN, NITRITE, LEUKOCYTESUR   Radiological Exams on Admission: Dg Chest Port 1 View  Result Date: 04/07/2019 CLINICAL DATA:  Shortness of breath since last Friday, cough, chest pain, history asthma, reported COVID-19 positive test at another hospital EXAM: PORTABLE CHEST 1 VIEW COMPARISON:  Portable exam 1455 hours without priors for comparison FINDINGS: Upper normal heart size. Normal mediastinal contours. Patchy BILATERAL pulmonary infiltrates likely infectious. No definite pleural effusion or pneumothorax. Osseous structures unremarkable. IMPRESSION: Patchy BILATERAL pulmonary infiltrates consistent with pneumonia, including atypical etiologies. Electronically Signed   By: Ulyses SouthwardMark  Boles M.D.   On: 04/07/2019 15:34    EKG: Independently reviewed. Sinus tachycardia  Assessment/Plan Active Problems:   COVID-19 virus infection   Covid-19 infection Acute respiratory failure with hypoxia Transferring to [facility]. -Oxygen therapy -Started on Solu-Medrol; [facility] -Daily d-dimer, CRP, CBC, CMP, ferritin -Precautions: droplet/contact  Asthma -Continue albuterol HFA -Steroids as mentioned above   DVT prophylaxis: Lovenox Code Status: Full code Family Communication: None. Patient declined for me to call family [facility] Consults called: None Admission status: Inpatient   Jacquelin Hawkingalph Jerianne Anselmo, MD [facility] 04/07/2019, 5:52 PM

## 2019-04-08 ENCOUNTER — Inpatient Hospital Stay (HOSPITAL_COMMUNITY): Payer: BC Managed Care – PPO

## 2019-04-08 DIAGNOSIS — J4520 Mild intermittent asthma, uncomplicated: Secondary | ICD-10-CM

## 2019-04-08 DIAGNOSIS — U071 COVID-19: Principal | ICD-10-CM

## 2019-04-08 DIAGNOSIS — J80 Acute respiratory distress syndrome: Secondary | ICD-10-CM

## 2019-04-08 LAB — URINALYSIS, ROUTINE W REFLEX MICROSCOPIC
Bilirubin Urine: NEGATIVE
Glucose, UA: NEGATIVE mg/dL
Hgb urine dipstick: NEGATIVE
Ketones, ur: NEGATIVE mg/dL
Nitrite: POSITIVE — AB
Protein, ur: 30 mg/dL — AB
Specific Gravity, Urine: 1.023 (ref 1.005–1.030)
WBC, UA: >50 WBC/hpf — ABNORMAL HIGH (ref 0–5)
pH: 6 (ref 5.0–8.0)

## 2019-04-08 LAB — CBC WITH DIFFERENTIAL/PLATELET
Abs Immature Granulocytes: 0.07 10*3/uL (ref 0.00–0.07)
Basophils Absolute: 0 10*3/uL (ref 0.0–0.1)
Basophils Relative: 0 %
Eosinophils Absolute: 0 10*3/uL (ref 0.0–0.5)
Eosinophils Relative: 0 %
HCT: 38.2 % (ref 36.0–46.0)
Hemoglobin: 12.4 g/dL (ref 12.0–15.0)
Immature Granulocytes: 1 %
Lymphocytes Relative: 5 %
Lymphs Abs: 0.3 10*3/uL — ABNORMAL LOW (ref 0.7–4.0)
MCH: 30.5 pg (ref 26.0–34.0)
MCHC: 32.5 g/dL (ref 30.0–36.0)
MCV: 93.9 fL (ref 80.0–100.0)
Monocytes Absolute: 0.1 10*3/uL (ref 0.1–1.0)
Monocytes Relative: 2 %
Neutro Abs: 5.6 10*3/uL (ref 1.7–7.7)
Neutrophils Relative %: 92 %
Platelets: 279 10*3/uL (ref 150–400)
RBC: 4.07 MIL/uL (ref 3.87–5.11)
RDW: 12.7 % (ref 11.5–15.5)
WBC: 6 10*3/uL (ref 4.0–10.5)
nRBC: 0 % (ref 0.0–0.2)

## 2019-04-08 LAB — POCT I-STAT 7, (LYTES, BLD GAS, ICA,H+H)
Acid-base deficit: 3 mmol/L — ABNORMAL HIGH (ref 0.0–2.0)
Acid-base deficit: 3 mmol/L — ABNORMAL HIGH (ref 0.0–2.0)
Acid-base deficit: 1 mmol/L (ref 0.0–2.0)
Bicarbonate: 20.6 mmol/L (ref 20.0–28.0)
Bicarbonate: 25.3 mmol/L (ref 20.0–28.0)
Bicarbonate: 24.7 mmol/L (ref 20.0–28.0)
Calcium, Ion: 1.22 mmol/L (ref 1.15–1.40)
Calcium, Ion: 1.25 mmol/L (ref 1.15–1.40)
Calcium, Ion: 1.28 mmol/L (ref 1.15–1.40)
HCT: 34 % — ABNORMAL LOW (ref 36.0–46.0)
HCT: 37 % (ref 36.0–46.0)
HCT: 34 % — ABNORMAL LOW (ref 36.0–46.0)
Hemoglobin: 11.6 g/dL — ABNORMAL LOW (ref 12.0–15.0)
Hemoglobin: 12.6 g/dL (ref 12.0–15.0)
Hemoglobin: 11.6 g/dL — ABNORMAL LOW (ref 12.0–15.0)
O2 Saturation: 91 %
O2 Saturation: 92 %
O2 Saturation: 100 %
Patient temperature: 98.7
Patient temperature: 98.8
Patient temperature: 98.4
Potassium: 3.6 mmol/L (ref 3.5–5.1)
Potassium: 3.8 mmol/L (ref 3.5–5.1)
Potassium: 4 mmol/L (ref 3.5–5.1)
Sodium: 143 mmol/L (ref 135–145)
Sodium: 143 mmol/L (ref 135–145)
Sodium: 143 mmol/L (ref 135–145)
TCO2: 22 mmol/L (ref 22–32)
TCO2: 27 mmol/L (ref 22–32)
TCO2: 26 mmol/L (ref 22–32)
pCO2 arterial: 31.4 mmHg — ABNORMAL LOW (ref 32.0–48.0)
pCO2 arterial: 62.2 mmHg — ABNORMAL HIGH (ref 32.0–48.0)
pCO2 arterial: 44.1 mmHg (ref 32.0–48.0)
pH, Arterial: 7.425 (ref 7.350–7.450)
pH, Arterial: 7.217 — ABNORMAL LOW (ref 7.350–7.450)
pH, Arterial: 7.356 (ref 7.350–7.450)
pO2, Arterial: 58 mmHg — ABNORMAL LOW (ref 83.0–108.0)
pO2, Arterial: 78 mmHg — ABNORMAL LOW (ref 83.0–108.0)
pO2, Arterial: 211 mmHg — ABNORMAL HIGH (ref 83.0–108.0)

## 2019-04-08 LAB — COMPREHENSIVE METABOLIC PANEL
ALT: 41 U/L (ref 0–44)
AST: 50 U/L — ABNORMAL HIGH (ref 15–41)
Albumin: 2.8 g/dL — ABNORMAL LOW (ref 3.5–5.0)
Alkaline Phosphatase: 70 U/L (ref 38–126)
Anion gap: 9 (ref 5–15)
BUN: 8 mg/dL (ref 6–20)
CO2: 22 mmol/L (ref 22–32)
Calcium: 8.2 mg/dL — ABNORMAL LOW (ref 8.9–10.3)
Chloride: 110 mmol/L (ref 98–111)
Creatinine, Ser: 0.57 mg/dL (ref 0.44–1.00)
GFR calc Af Amer: >60 mL/min (ref >60)
GFR calc non Af Amer: >60 mL/min (ref >60)
Glucose, Bld: 182 mg/dL — ABNORMAL HIGH (ref 70–99)
Potassium: 3.5 mmol/L (ref 3.5–5.1)
Sodium: 141 mmol/L (ref 135–145)
Total Bilirubin: 0.3 mg/dL (ref 0.3–1.2)
Total Protein: 6.6 g/dL (ref 6.5–8.1)

## 2019-04-08 LAB — LIPID PANEL
Cholesterol: 119 mg/dL (ref 0–200)
HDL: 59 mg/dL (ref >40)
LDL Cholesterol: 45 mg/dL (ref 0–99)
Total CHOL/HDL Ratio: 2 RATIO
Triglycerides: 76 mg/dL (ref <150)
VLDL: 15 mg/dL (ref 0–40)

## 2019-04-08 LAB — RESPIRATORY PANEL BY PCR

## 2019-04-08 LAB — PROCALCITONIN: Procalcitonin: <0.1 ng/mL

## 2019-04-08 LAB — PHOSPHORUS
Phosphorus: 2.8 mg/dL (ref 2.5–4.6)
Phosphorus: 4.3 mg/dL (ref 2.5–4.6)

## 2019-04-08 LAB — FERRITIN: Ferritin: 666 ng/mL — ABNORMAL HIGH (ref 11–307)

## 2019-04-08 LAB — ABO/RH: ABO/RH(D): O POS

## 2019-04-08 LAB — GLUCOSE, CAPILLARY
Glucose-Capillary: 137 mg/dL — ABNORMAL HIGH (ref 70–99)
Glucose-Capillary: 133 mg/dL — ABNORMAL HIGH (ref 70–99)
Glucose-Capillary: 173 mg/dL — ABNORMAL HIGH (ref 70–99)

## 2019-04-08 LAB — MAGNESIUM
Magnesium: 1.9 mg/dL (ref 1.7–2.4)
Magnesium: 2 mg/dL (ref 1.7–2.4)

## 2019-04-08 LAB — MRSA PCR SCREENING: MRSA by PCR: NEGATIVE

## 2019-04-08 LAB — FIBRINOGEN: Fibrinogen: 679 mg/dL — ABNORMAL HIGH (ref 210–475)

## 2019-04-08 LAB — D-DIMER, QUANTITATIVE: D-Dimer, Quant: 0.41 ug/mL-FEU (ref 0.00–0.50)

## 2019-04-08 LAB — TROPONIN I: Troponin I: <0.03 ng/mL (ref <0.03)

## 2019-04-08 LAB — SEDIMENTATION RATE: Sed Rate: 70 mm/hr — ABNORMAL HIGH (ref 0–22)

## 2019-04-08 LAB — C-REACTIVE PROTEIN: CRP: 20.1 mg/dL — ABNORMAL HIGH (ref <1.0)

## 2019-04-08 LAB — HIV ANTIBODY (ROUTINE TESTING W REFLEX): HIV Screen 4th Generation wRfx: NONREACTIVE

## 2019-04-08 LAB — LACTATE DEHYDROGENASE: LDH: 324 U/L — ABNORMAL HIGH (ref 98–192)

## 2019-04-08 LAB — BRAIN NATRIURETIC PEPTIDE: B Natriuretic Peptide: 70.2 pg/mL (ref 0.0–100.0)

## 2019-04-08 LAB — CK: Total CK: 66 U/L (ref 38–234)

## 2019-04-08 MED ORDER — MIDAZOLAM HCL 2 MG/2ML IJ SOLN
INTRAMUSCULAR | Status: AC
  Administered 2019-04-08: 2 mg
  Filled 2019-04-08: qty 4

## 2019-04-08 MED ORDER — ALBUTEROL SULFATE (2.5 MG/3ML) 0.083% IN NEBU: 2.5000 mg | INHALATION_SOLUTION | RESPIRATORY_TRACT | Status: DC | PRN

## 2019-04-08 MED ORDER — FENTANYL CITRATE (PF) 100 MCG/2ML IJ SOLN
INTRAMUSCULAR | Status: AC
  Administered 2019-04-08: 50 ug
  Filled 2019-04-08: qty 2

## 2019-04-08 MED ORDER — TOCILIZUMAB 400 MG/20ML IV SOLN
8.0000 mg/kg | Freq: Once | INTRAVENOUS | Status: AC
  Administered 2019-04-08: 538 mg via INTRAVENOUS
  Filled 2019-04-08: qty 26.9

## 2019-04-08 MED ORDER — FENTANYL 2500MCG IN NS 250ML (10MCG/ML) PREMIX INFUSION
50.0000 ug/h | INTRAVENOUS | Status: DC
  Administered 2019-04-08: 50 ug/h via INTRAVENOUS
  Administered 2019-04-09 – 2019-04-10 (×3): 200 ug/h via INTRAVENOUS
  Filled 2019-04-08 (×4): qty 250

## 2019-04-08 MED ORDER — MIDAZOLAM HCL 2 MG/2ML IJ SOLN
2.0000 mg | INTRAMUSCULAR | Status: DC | PRN
  Administered 2019-04-08 – 2019-04-09 (×2): 2 mg via INTRAVENOUS
  Filled 2019-04-08 (×2): qty 2

## 2019-04-08 MED ORDER — ETOMIDATE 2 MG/ML IV SOLN
INTRAVENOUS | Status: AC
  Administered 2019-04-08: 20 mg
  Filled 2019-04-08: qty 20

## 2019-04-08 MED ORDER — SODIUM CHLORIDE 0.9% IV SOLUTION
Freq: Once | INTRAVENOUS | Status: AC
  Administered 2019-04-09: 05:00:00 via INTRAVENOUS

## 2019-04-08 MED ORDER — FENTANYL BOLUS VIA INFUSION
50.0000 ug | INTRAVENOUS | Status: DC | PRN
  Administered 2019-04-09 (×6): 50 ug via INTRAVENOUS
  Filled 2019-04-08: qty 50

## 2019-04-08 MED ORDER — ENOXAPARIN SODIUM 40 MG/0.4ML ~~LOC~~ SOLN
40.0000 mg | Freq: Two times a day (BID) | SUBCUTANEOUS | Status: DC
  Administered 2019-04-08 – 2019-04-20 (×22): 40 mg via SUBCUTANEOUS
  Filled 2019-04-08 (×22): qty 0.4

## 2019-04-08 MED ORDER — ROCURONIUM BROMIDE 10 MG/ML (PF) SYRINGE
PREFILLED_SYRINGE | INTRAVENOUS | Status: AC
  Filled 2019-04-08: qty 10

## 2019-04-08 MED ORDER — VITAL HIGH PROTEIN PO LIQD
1000.0000 mL | ORAL | Status: DC
  Administered 2019-04-08 – 2019-04-09 (×2): 1000 mL

## 2019-04-08 MED ORDER — ORAL CARE MOUTH RINSE
15.0000 mL | OROMUCOSAL | Status: DC
  Administered 2019-04-08 – 2019-04-13 (×33): 15 mL via OROMUCOSAL

## 2019-04-08 MED ORDER — CHLORHEXIDINE GLUCONATE 0.12% ORAL RINSE (MEDLINE KIT)
15.0000 mL | Freq: Two times a day (BID) | OROMUCOSAL | Status: DC
  Administered 2019-04-08 – 2019-04-18 (×15): 15 mL via OROMUCOSAL

## 2019-04-08 MED ORDER — SODIUM CHLORIDE 0.9 % IV SOLN
INTRAVENOUS | Status: DC | PRN
  Administered 2019-04-08: 1000 mL via INTRAVENOUS
  Administered 2019-04-12 – 2019-04-14 (×2): 250 mL via INTRAVENOUS

## 2019-04-08 MED ORDER — MIDAZOLAM HCL 2 MG/2ML IJ SOLN
2.0000 mg | INTRAMUSCULAR | Status: DC | PRN
  Administered 2019-04-08 – 2019-04-09 (×4): 2 mg via INTRAVENOUS
  Filled 2019-04-08 (×4): qty 2

## 2019-04-08 MED ORDER — PANTOPRAZOLE SODIUM 40 MG PO PACK
40.0000 mg | PACK | Freq: Every day | ORAL | Status: DC
  Administered 2019-04-08 – 2019-04-10 (×3): 40 mg
  Filled 2019-04-08 (×6): qty 20

## 2019-04-08 MED ORDER — ROCURONIUM BROMIDE 10 MG/ML (PF) SYRINGE
PREFILLED_SYRINGE | INTRAVENOUS | Status: AC
  Administered 2019-04-08: 70 mg
  Filled 2019-04-08: qty 10

## 2019-04-08 MED ORDER — CHLORHEXIDINE GLUCONATE CLOTH 2 % EX PADS
6.0000 | MEDICATED_PAD | Freq: Every day | CUTANEOUS | Status: DC
  Administered 2019-04-09 – 2019-04-20 (×7): 6 via TOPICAL

## 2019-04-08 MED ORDER — FENTANYL CITRATE (PF) 100 MCG/2ML IJ SOLN
50.0000 ug | Freq: Once | INTRAMUSCULAR | Status: AC
  Administered 2019-04-08: 16:00:00 50 ug via INTRAVENOUS

## 2019-04-08 MED ORDER — PRO-STAT SUGAR FREE PO LIQD
30.0000 mL | Freq: Two times a day (BID) | ORAL | Status: DC
  Administered 2019-04-08 – 2019-04-09 (×2): 30 mL
  Filled 2019-04-08 (×2): qty 30

## 2019-04-08 NOTE — Progress Notes (Signed)
Called to patient's room for rapid response call for increased respiratory distress.  Upon arrival to patient's room, patient was tachypneic with RR 30-40, SpO2 88% on 6l [place].  Placed patient on NRB for SpO2 96%, but no relief in RR or distress.  RT and RN transferred the patient to the ICU.  Upon arrival to the ICU, patient was placed on HFNC at 40l with 100% FiO2.  ABG was obtained.  Patient's respiratory distress was still noted, with patient stating that she was getting tired.  MD paged with ABG results.  MD arrived and decision was made to intubate the patient.  Patient intubated with 7.5 ETT secured at 24 at the lip.  Easy cap positive for color change post intubation, and bilateral breath sounds noted.  Patient placed on vent with setting PRVC RR 26, VT 300, PEEP 12, FiO2 100%.  Will continue to monitor.

## 2019-04-08 NOTE — Progress Notes (Signed)
Prone position

## 2019-04-08 NOTE — Progress Notes (Signed)
Spoke with pt's daughter Martyn Malay (per pt's request). She is aware that pt is now in ICU requiring intubation. All her questions were answered and she was told that MD would call her this afternoon with more detailed information. She was very appreciative of our care. We will update her as needed. Deadrick Stidd, [place] Scrape, RN

## 2019-04-08 NOTE — Procedures (Signed)
Intubation Procedure Note Joyce Mcguire [id number] 03/08/1966  Procedure: Intubation Indications: Respiratory insufficiency  Procedure Details Consent: Risks of procedure as well as the alternatives and risks of each were explained to the (patient/caregiver).  Consent for procedure obtained. Time Out: Verified patient identification, verified procedure, site/side was marked, verified correct patient position, special equipment/implants available, medications/allergies/relevent history reviewed, required imaging and test results available.  Performed  Drugs Etomidate 82m IV, Versed 241mIV, Fentanyl 5051mIV, Rocuronium 100m29m DL x 1 with MAC 4 blade Grade 1 view 8.0 ET tube passed through cords under direct visualization Placement confirmed with bilateral breath sounds, positive EtCO2 change and smoke in tube   Evaluation Hemodynamic Status: BP stable throughout; O2 sats: stable throughout Patient's Current Condition: stable Complications: No apparent complications Patient did tolerate procedure well. Chest X-ray ordered to verify placement.  CXR: pending.   Joyce Mcguire/2020

## 2019-04-08 NOTE — Progress Notes (Signed)
Spoke with patients daughter. Updated on patient condition. Patient cell phone also plugged in at bedside per family request.

## 2019-04-08 NOTE — Research (Signed)
[name], MD, consented Joyce Mcguire (female, Date of Birth 29-Sep-1966, 53 y.o.) and with diagnosis of COVID-19, in the [facility] Expanded Access Program (EAP) Research Protocol for [employer] against COVID-19.  The consent took place under following circumstances.   Joyce Capacity assessed by this investigator as:  Intubated sedated  Consent took place in the following setting(s):  With the patient's next of kin, daughter Vernell Barrier (fluent in [place])   The following were present for the consent process:  McQuaid, Daughter, Allyson Sabal   A copy of the cover letter and signed consent document was provided to Joyce/LAR.  The original signed consent document has been placed in the Joyce's physical chart and will be scanned into the electronic medical record upon discharge.  Statement of acknowledgement that the following was discussed with the Joyce/LAR:    1) Discussed the purpose of the research and procedures  2) Discussed risks and benefits and uncertainties of study participation 3) Discussed Joyce's responsibilities  4) Discussed the measures in place to maintain Joyce's confidentiality while a participant on the trial  5) Discussed alternatives to study participation.   6) Discussed study participation is voluntary and that the Joyce's care would not be jeopardized if they declined participation in the study.   7) Discussed freedom to withdraw at any time.   8) All Joyce/LAR questions were answered to their satisfaction.   9) In case of emergency consent, investigator agreed to discuss with Joyce/LAR at earliest available opportunity when the Joyce stabilizes and/or LAR can be located.    Investigator note: I have yet to enroll the patient in the program because the new patient link is not working this evening, will continue to attempt to enroll tonight.   Final Investigator Signature  Veto Kemps   Date: 04/08/2019 and 9:12  PM

## 2019-04-08 NOTE — Progress Notes (Signed)
PROGRESS NOTE  Joyce Mcguire  ZOX:[id number] DOB: Aug 11, 1966 DOA: 04/07/2019 PCP: System, Pcp Not In  Brief Narrative: Joyce Mcguire is a 53 y.o. female with a history of well-controlled asthma who presented to the ED with cough, and worsening shortness of breath since diagnosis with covid-10 5 days prior.    Assessment & Plan: Active Problems:   COVID-19 virus infection   Asthma  Covid-19 infection: Felt to be at risk of progression to ARDS based on current assessment, namely time in course of illness, history of asthma, and severity of current symptoms/inflammatory marker elevation.  - Continue supplemental oxygen as needed to maintain SpO2 >90%. Continue to prone and use incentive spirometry. - Has started remdesivir, steroids, and given tocilizumab this morning at 5am.  - Continue airborne, contact precautions. PPE including surgical gown, gloves, face shield, cap, shoe covers, and N-95 used during this encounter in a negative pressure room.  - Check daily labs: CBC w/diff, CMP, d-dimer, fibrinogen, ferritin, LDH, CRP - Troponin is negative.  - Maintain euvolemia/net negative.  - Avoid NSAIDs  Asthma: No current wheezing.  - On steroids as above, prn albuterol  Pyuria, nitrite-positive bacteriuria:  - No symptoms of UTI per pt.   DVT prophylaxis: Lovenox, augment dose Code Status: Full code confirmed this morning.  Family Communication: None at bedside, reluctant about me calling family. Disposition Plan: Uncertain, guarded prognosis  Consultants:   PCCM  Procedures:   None  Antimicrobials:  Remdesivir   Subjective: Feels breathing is about the same as admission, has been proning this evening into this morning. Feels short of breath, no chest pain or wheezing.   Objective: Vitals:   04/08/19 0350 04/08/19 0400 04/08/19 0700 04/08/19 1200  BP:  129/81 133/80 (!) 153/88  Pulse:  80 82 82  Resp:  (!) 41 (!) 40 (!) 44  Temp:   98.9 F (37.2 C) 98.5 F (36.9 C)   TempSrc:   Axillary Oral  SpO2: 93% 96% 91% 91%  Weight:      Height:        Intake/Output Summary (Last 24 hours) at 04/08/2019 1343 Last data filed at 04/08/2019 0330 Gross per 24 hour  Intake -  Output 700 ml  Net -700 ml   Filed Weights   04/07/19 1425 04/07/19 2003  Weight: 68 kg 67.3 kg    Gen: 53 y.o. female in no distress Pulm: Non-labored tachypnea at ~20/min. Clear to auscultation bilaterally.  CV: Regular rate and rhythm. No murmur, rub, or gallop. No JVD, no pedal edema. GI: Abdomen soft, non-tender, non-distended, with normoactive bowel sounds. No organomegaly or masses felt. Ext: Warm, no deformities Skin: No rashes, lesions or ulcers Neuro: Alert and oriented. No focal neurological deficits. Psych: Judgement and insight appear normal. Mood & affect appropriate.   Data Reviewed: I have personally reviewed following labs and imaging studies  CBC: Recent Labs  Lab 04/07/19 1452 04/08/19 0420  WBC 7.8 6.0  NEUTROABS 7.1 5.6  HGB 12.9 12.4  HCT 38.5 38.2  MCV 93.4 93.9  PLT 251 279   Basic Metabolic Panel: Recent Labs  Lab 04/07/19 1452 04/08/19 0420  NA 137 141  K 3.2* 3.5  CL 105 110  CO2 24 22  GLUCOSE 141* 182*  BUN 6 8  CREATININE 0.59 0.57  CALCIUM 8.6* 8.2*  MG  --  1.9  PHOS  --  2.8   GFR: Estimated Creatinine Clearance: 68.8 mL/min (by C-G formula based on SCr of 0.57 mg/dL). Liver Function  Tests: Recent Labs  Lab 04/07/19 1452 04/08/19 0420  AST 50* 50*  ALT 40 41  ALKPHOS 70 70  BILITOT 0.5 0.3  PROT 7.4 6.6  ALBUMIN 3.4* 2.8*   No results for input(s): LIPASE, AMYLASE in the last 168 hours. No results for input(s): AMMONIA in the last 168 hours. Coagulation Profile: No results for input(s): INR, PROTIME in the last 168 hours. Cardiac Enzymes: Recent Labs  Lab 04/08/19 0420  CKTOTAL 66  TROPONINI <0.03   BNP (last 3 results) No results for input(s): PROBNP in the last 8760 hours. HbA1C: No results for input(s):  HGBA1C in the last 72 hours. CBG: No results for input(s): GLUCAP in the last 168 hours. Lipid Profile: Recent Labs    04/07/19 1632 04/08/19 0420  CHOL  --  119  HDL  --  59  LDLCALC  --  45  TRIG 91 76  CHOLHDL  --  2.0   Thyroid Function Tests: No results for input(s): TSH, T4TOTAL, FREET4, T3FREE, THYROIDAB in the last 72 hours. Anemia Panel: Recent Labs    04/07/19 1632 04/08/19 0420  FERRITIN 568* 666*   Urine analysis:    Component Value Date/Time   COLORURINE YELLOW 04/08/2019 0830   APPEARANCEUR HAZY (A) 04/08/2019 0830   LABSPEC 1.023 04/08/2019 0830   PHURINE 6.0 04/08/2019 0830   GLUCOSEU NEGATIVE 04/08/2019 0830   HGBUR NEGATIVE 04/08/2019 0830   BILIRUBINUR NEGATIVE 04/08/2019 0830   KETONESUR NEGATIVE 04/08/2019 0830   PROTEINUR 30 (A) 04/08/2019 0830   NITRITE POSITIVE (A) 04/08/2019 0830   LEUKOCYTESUR MODERATE (A) 04/08/2019 0830   Recent Results (from the past 240 hour(s))  SARS Coronavirus 2 (CEPHEID- Performed in [facility] hospital lab), Hosp Order     Status: Abnormal   Collection Time: 04/07/19  2:53 PM  Result Value Ref Range Status   SARS Coronavirus 2 POSITIVE (A) NEGATIVE Final    Comment: RESULT CALLED TO, READ BACK BY AND VERIFIED WITH: Pablo Ledger [id number] @ 1700 BY J SCOTTON (NOTE) If result is NEGATIVE SARS-CoV-2 target nucleic acids are NOT DETECTED. The SARS-CoV-2 RNA is generally detectable in upper and lower  respiratory specimens during the acute phase of infection. The lowest  concentration of SARS-CoV-2 viral copies this assay can detect is 250  copies / mL. A negative result does not preclude SARS-CoV-2 infection  and should not be used as the sole basis for treatment or other  patient management decisions.  A negative result may occur with  improper specimen collection / handling, submission of specimen other  than nasopharyngeal swab, presence of viral mutation(s) within the  areas targeted by this assay, and  inadequate number of viral copies  (<250 copies / mL). A negative result must be combined with clinical  observations, patient history, and epidemiological information. If result is POSITIVE SARS-CoV-2 target nucleic acids are DETEC TED. The SARS-CoV-2 RNA is generally detectable in upper and lower  respiratory specimens during the acute phase of infection.  Positive  results are indicative of active infection with SARS-CoV-2.  Clinical  correlation with patient history and other diagnostic information is  necessary to determine patient infection status.  Positive results do  not rule out bacterial infection or co-infection with other viruses. If result is PRESUMPTIVE POSTIVE SARS-CoV-2 nucleic acids MAY BE PRESENT.   A presumptive positive result was obtained on the submitted specimen  and confirmed on repeat testing.  While 2019 novel coronavirus  (SARS-CoV-2) nucleic acids may be present in the  submitted sample  additional confirmatory testing may be necessary for epidemiological  and / or clinical management purposes  to differentiate between  SARS-CoV-2 and other Sarbecovirus currently known to infect humans.  If clinically indicated additional testing with an alternate test  methodology (LAB7 453) is advised. The SARS-CoV-2 RNA is generally  detectable in upper and lower respiratory specimens during the acute  phase of infection. The expected result is Negative. Fact Sheet for Patients:  BoilerBrush.com.cyhttps://www.fda.gov/media/136312/download Fact Sheet for Healthcare Providers: https://pope.com/https://www.fda.gov/media/136313/download This test is not yet approved or cleared by the [place] FDA and has been authorized for detection and/or diagnosis of SARS-CoV-2 by FDA under an Emergency Use Authorization (EUA).  This EUA will remain in effect (meaning this test can be used) for the duration of the COVID-19 declaration under Section 564(b)(1) of the Act, 21 U.S.C. section 360bbb-3(b)(1), unless the  authorization is terminated or revoked sooner. Performed at [facility], [address] [place], [place] [address]   Blood culture (routine x 2)     Status: None (Preliminary result)   Collection Time: 04/07/19  4:21 PM  Result Value Ref Range Status   Specimen Description   Final    BLOOD RIGHT HAND Performed at [facility], [address] [place], [place] [address]    Special Requests   Final    BOTTLES DRAWN AEROBIC ONLY Blood Culture adequate volume Performed at [facility], [address] [place], [place] [address]    Culture   Final    NO GROWTH < 12 HOURS Performed at [facility], [address] [place], [place] [address]    Report Status PENDING  Incomplete  Blood culture (routine x 2)     Status: None (Preliminary result)   Collection Time: 04/07/19  4:26 PM  Result Value Ref Range Status   Specimen Description   Final    BLOOD RIGHT HAND Performed at [facility], [address] [place], [place] [address]    Special Requests   Final    BOTTLES DRAWN AEROBIC ONLY Blood Culture adequate volume Performed at [facility], [address] [place], [place] [address]    Culture   Final    NO GROWTH < 12 HOURS Performed at [facility], [address] [place], [place] [address]    Report Status PENDING  Incomplete  MRSA PCR Screening     Status: None   Collection Time: 04/07/19 11:58 PM  Result Value Ref Range Status   MRSA by PCR NEGATIVE NEGATIVE Final    Comment:        The GeneXpert MRSA Assay (FDA approved for NASAL specimens only), is one component of a comprehensive MRSA colonization surveillance program. It is not intended to diagnose MRSA infection nor to guide or monitor treatment for MRSA infections. Performed at [facility], [address] [place], [place] [address]   Respiratory Panel by PCR     Status: None    Collection Time: 04/08/19  8:45 AM  Result Value Ref Range Status   Adenovirus NOT DETECTED NOT DETECTED Final   Coronavirus 229E NOT DETECTED NOT DETECTED Final    Comment: (NOTE) The Coronavirus on the Respiratory Panel, DOES NOT test for the novel  Coronavirus (2019 nCoV)    Coronavirus HKU1 NOT DETECTED NOT DETECTED Final   Coronavirus NL63 NOT DETECTED NOT DETECTED Final   Coronavirus OC43 NOT DETECTED NOT DETECTED Final  Metapneumovirus NOT DETECTED NOT DETECTED Final   Rhinovirus / Enterovirus NOT DETECTED NOT DETECTED Final   Influenza A NOT DETECTED NOT DETECTED Final   Influenza B NOT DETECTED NOT DETECTED Final   Parainfluenza Virus 1 NOT DETECTED NOT DETECTED Final   Parainfluenza Virus 2 NOT DETECTED NOT DETECTED Final   Parainfluenza Virus 3 NOT DETECTED NOT DETECTED Final   Parainfluenza Virus 4 NOT DETECTED NOT DETECTED Final   Respiratory Syncytial Virus NOT DETECTED NOT DETECTED Final   Bordetella pertussis NOT DETECTED NOT DETECTED Final   Chlamydophila pneumoniae NOT DETECTED NOT DETECTED Final   Mycoplasma pneumoniae NOT DETECTED NOT DETECTED Final    Comment: Performed at [facility], [address] [place], [place] [address]      Radiology Studies: Dg Chest Port 1 View  Result Date: 04/07/2019 CLINICAL DATA:  Shortness of breath since last Friday, cough, chest pain, history asthma, reported COVID-19 positive test at another hospital EXAM: PORTABLE CHEST 1 VIEW COMPARISON:  Portable exam 1455 hours without priors for comparison FINDINGS: Upper normal heart size. Normal mediastinal contours. Patchy BILATERAL pulmonary infiltrates likely infectious. No definite pleural effusion or pneumothorax. Osseous structures unremarkable. IMPRESSION: Patchy BILATERAL pulmonary infiltrates consistent with pneumonia, including atypical etiologies. Electronically Signed   By: Ulyses Southward M.D.   On: 04/07/2019 15:34    Scheduled Meds: . enoxaparin (LOVENOX) injection  40  mg Subcutaneous Q24H  . Ipratropium-Albuterol  1 puff Inhalation Q6H  . methylPREDNISolone (SOLU-MEDROL) injection  40 mg Intravenous Q8H  . vitamin C  500 mg Oral Daily  . zinc sulfate  220 mg Oral Daily   Continuous Infusions: . sodium chloride 75 mL/hr at 04/08/19 1241  . remdesivir 100 mg in NS 250 mL       LOS: 1 day   Time spent: 35 minutes.  Tyrone Nine, MD [facility] [email or web address] Password [email or web address] 04/08/2019, 1:43 PM

## 2019-04-08 NOTE — Progress Notes (Addendum)
Staff was bathing and assisting patient toilet. She began using accessory musles to breath and sat had decline 80-84% with difficulties in taking effective deep breaths. Pt was noted to be in respiratory distress. Rapid response was called. Notified MD rapid will transfer pt to ICU.   Before rapid response attended to patient, float RN administered albuterol to patient.

## 2019-04-08 NOTE — Progress Notes (Signed)
[facility] PCCM  Patient enrollment for USCOVIDPLASMA complete  Heber Dresden, MD [place] [facility]M Pager: [phone] Cell: [phone] If no response, call [phone]

## 2019-04-08 NOTE — Consult Note (Signed)
NAME:  Joyce Mcguire, MRN:  [id number], DOB:  [birth year], LOS: 1 ADMISSION DATE:  04/07/2019, CONSULTATION DATE: April 08, 2019 REFERRING MD: Dr. Jarvis Newcomer, CHIEF COMPLAINT: Dyspnea  Brief History   53 year old female with no past medical history admitted for severe acute respiratory failure with hypoxemia due to COVID-19 pneumonia.  History of present illness   This is a 53 year old non-smoking female who says she never had any prior lung history who presented to [facility] on April 07, 2019 after being diagnosed with COVID pneumonia.  She had been diagnosed with COVID approximately 5 days prior to her arrival at the [facility] emergency room on June 1 at which point she was complaining of shortness of breath.  On my history she was quite short of breath and unable to complete sentences so she was not able to give a detailed history so history is obtained by chart review.  She was admitted by the hospitalist service, administered steroids, Tocilizumab, and Remdesivir.  However despite these interventions her oxygen needs have been steadily increasing and she has become more fatigued, breathing faster, and complaining of more shortness of breath throughout the course of the day.  She was brought to the intensive care unit for closer monitoring.  She was initially started on heated high flow oxygen which was able to improve her oxygen levels but the patient continued to complain of worsening shortness of breath and accessory muscle use.  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  Past Medical History  "asthma" > patient says that she was never diagnosed with this prior to being diagnosed with COVID  Significant Hospital Events   6/1 admission 6/2 intubation  Consults:  Pulmonary and critical care medicine  Procedures:  6/1  ETT>  Significant Diagnostic Tests:    Micro Data:  May 26 SARS-CoV-2 positive 6/1 SARS-CoV-2 positive June 1 blood culture negative 6/2 RVP > negative  Antimicrobials:  June 1 remdesivir >   Interim history/subjective:  As above  Objective   Blood pressure 131/78, pulse 83, temperature 98.7 F (37.1 C), temperature source Oral, resp. rate 19, height 5' (1.524 m), weight 67.3 kg, SpO2 91 %.        Intake/Output Summary (Last 24 hours) at 04/08/2019 1447 Last data filed at 04/08/2019 1300 Gross per 24 hour  Intake 996.44 ml  Output 700 ml  Net 296.44 ml   Filed Weights   04/07/19 1425 04/07/19 2003  Weight: 68 kg 67.3 kg    Examination:  General: In bed, tachypnea, accessory muscle use, not completing sentences HENT: NCAT OP clear PULM: Crackles bases B, normal effort, no wheezing CV: RRR, no mgr GI: BS+, soft, nontender MSK: normal bulk and tone Neuro: awake, alert, MAEW Psyche: anxious  April 07, 2019 chest x-ray images independently reviewed showing bilateral airspace disease.   Resolved Hospital Problem list     Assessment & Plan:  ARDS due to COVID-19: Currently with increased work of breathing, accessory muscle use, paradoxical breathing worrisome for impending ventilatory failure Intubate now ARDS ventilator protocol Target tidal volume 6 cc/kg IBW ABG after intubation Chest x-ray after intubation Pantoprazole for stress ulcer prophylaxis Start sedation protocol, target RA SS score -2, fentanyl infusion, as needed midazolam Ventilator associated pneumonia prevention protocol If ABG shows PaO2 to FiO2 ratio  less than 150 then will need to be in the prone position Plateau pressure less than 30 Start consent process for convalescent plasma Continue Solu-Medrol Continue Actemra Continue Remdesivir  Asthma: doubt Prn albuterol    Best practice:  Diet: start tube feeding Pain/Anxiety/Delirium protocol (if indicated): As above VAP protocol (if  indicated): Yes DVT prophylaxis: Enoxaparin per COVID protocol GI prophylaxis: Pantoprazole Glucose control: Sliding scale insulin Mobility: Bedrest Code Status: Full Family Communication: I have updated her daughter-in-law [name] at length today Disposition: Remain in ICU  Labs   CBC: Recent Labs  Lab 04/07/19 1452 04/08/19 0420 04/08/19 1357  WBC 7.8 6.0  --   NEUTROABS 7.1 5.6  --   HGB 12.9 12.4 11.6*  HCT 38.5 38.2 34.0*  MCV 93.4 93.9  --   PLT 251 279  --     Basic Metabolic Panel: Recent Labs  Lab 04/07/19 1452 04/08/19 0420 04/08/19 1357  NA 137 141 143  K 3.2* 3.5 3.6  CL 105 110  --   CO2 24 22  --   GLUCOSE 141* 182*  --   BUN 6 8  --   CREATININE 0.59 0.57  --   CALCIUM 8.6* 8.2*  --   MG  --  1.9  --   PHOS  --  2.8  --    GFR: Estimated Creatinine Clearance: 68.8 mL/min (by C-G formula based on SCr of 0.57 mg/dL). Recent Labs  Lab 04/07/19 1452 04/07/19 1500 04/07/19 1621 04/08/19 0420  PROCALCITON  --  <0.10  --  <0.10  WBC 7.8  --   --  6.0  LATICACIDVEN  --   --  1.3  --     Liver Function Tests: Recent Labs  Lab 04/07/19 1452 04/08/19 0420  AST 50* 50*  ALT 40 41  ALKPHOS 70 70  BILITOT 0.5 0.3  PROT 7.4 6.6  ALBUMIN 3.4* 2.8*   No results for input(s): LIPASE, AMYLASE in the last 168 hours. No results for input(s): AMMONIA in the last 168 hours.  ABG    Component Value Date/Time   PHART 7.425 04/08/2019 1357   PCO2ART 31.4 (L) 04/08/2019 1357   PO2ART 58.0 (L) 04/08/2019 1357   HCO3 20.6 04/08/2019 1357   TCO2 22 04/08/2019 1357   ACIDBASEDEF 3.0 (H) 04/08/2019 1357   O2SAT 91.0 04/08/2019 1357     Coagulation Profile: No results for input(s): INR, PROTIME in the last 168 hours.  Cardiac Enzymes: Recent Labs  Lab 04/08/19 0420  CKTOTAL 66  TROPONINI <0.03    HbA1C: No results found for: HGBA1C  CBG: No results for input(s): GLUCAP in the last 168 hours.  Review of Systems:   Cannot obtain due to  severe respiratory failure  Past Medical History  She,  has no past medical history on file.   Surgical History   History reviewed. No pertinent surgical history.   Social History      Family History   Her family history is not on file.   Allergies Allergies  Allergen Reactions  . Lactose Intolerance (Gi) Diarrhea and Nausea And Vomiting     Home Medications  Prior to Admission medications   Medication Sig Start Date End Date Taking? Authorizing Provider  albuterol (VENTOLIN HFA) 108 (90 Base) MCG/ACT inhaler Inhale 2 puffs into the lungs every 4 (four) hours as needed for shortness of breath or wheezing. 04/01/19  Yes [provider]  ibuprofen (ADVIL) 800 MG tablet Take 800 mg by mouth  every 8 (eight) hours as needed for pain. 04/02/19 04/12/19 Yes [provider]     Critical care time: 35 minutes     Heber CarolinaBrent Roselinda Bahena, MD [place] [facility] Pager: [phone] Cell: [phone] If no response, call [phone]

## 2019-04-08 NOTE — Progress Notes (Signed)
CardioVascular Research Department and AHF Team  ReDS Research Project   Patient #: [id number]  ReDS Measurement  Right: 61 %  Left: 61 %

## 2019-04-08 NOTE — Care Management (Signed)
Case manager will continue to follow for appropriate disposition as patient medically improves. May God bless her to do so.

## 2019-04-08 NOTE — Progress Notes (Signed)
Communicated with daughter in law of pt status.

## 2019-04-09 DIAGNOSIS — R739 Hyperglycemia, unspecified: Secondary | ICD-10-CM

## 2019-04-09 DIAGNOSIS — J069 Acute upper respiratory infection, unspecified: Secondary | ICD-10-CM

## 2019-04-09 DIAGNOSIS — J9621 Acute and chronic respiratory failure with hypoxia: Secondary | ICD-10-CM

## 2019-04-09 LAB — CBC WITH DIFFERENTIAL/PLATELET
Abs Immature Granulocytes: 0.04 10*3/uL (ref 0.00–0.07)
Basophils Absolute: 0 10*3/uL (ref 0.0–0.1)
Basophils Relative: 0 %
Eosinophils Absolute: 0 10*3/uL (ref 0.0–0.5)
Eosinophils Relative: 0 %
HCT: 36.9 % (ref 36.0–46.0)
Hemoglobin: 11.8 g/dL — ABNORMAL LOW (ref 12.0–15.0)
Immature Granulocytes: 1 %
Lymphocytes Relative: 8 %
Lymphs Abs: 0.6 10*3/uL — ABNORMAL LOW (ref 0.7–4.0)
MCH: 30.6 pg (ref 26.0–34.0)
MCHC: 32 g/dL (ref 30.0–36.0)
MCV: 95.6 fL (ref 80.0–100.0)
Monocytes Absolute: 0.2 10*3/uL (ref 0.1–1.0)
Monocytes Relative: 3 %
Neutro Abs: 6.5 10*3/uL (ref 1.7–7.7)
Neutrophils Relative %: 88 %
Platelets: 308 10*3/uL (ref 150–400)
RBC: 3.86 MIL/uL — ABNORMAL LOW (ref 3.87–5.11)
RDW: 13 % (ref 11.5–15.5)
WBC: 7.3 10*3/uL (ref 4.0–10.5)
nRBC: 0 % (ref 0.0–0.2)

## 2019-04-09 LAB — COMPREHENSIVE METABOLIC PANEL
ALT: 37 U/L (ref 0–44)
AST: 38 U/L (ref 15–41)
Albumin: 2.5 g/dL — ABNORMAL LOW (ref 3.5–5.0)
Alkaline Phosphatase: 63 U/L (ref 38–126)
Anion gap: 4 — ABNORMAL LOW (ref 5–15)
BUN: 20 mg/dL (ref 6–20)
CO2: 24 mmol/L (ref 22–32)
Calcium: 8.2 mg/dL — ABNORMAL LOW (ref 8.9–10.3)
Chloride: 113 mmol/L — ABNORMAL HIGH (ref 98–111)
Creatinine, Ser: 0.61 mg/dL (ref 0.44–1.00)
GFR calc Af Amer: >60 mL/min (ref >60)
GFR calc non Af Amer: >60 mL/min (ref >60)
Glucose, Bld: 204 mg/dL — ABNORMAL HIGH (ref 70–99)
Potassium: 3.8 mmol/L (ref 3.5–5.1)
Sodium: 141 mmol/L (ref 135–145)
Total Bilirubin: 0.2 mg/dL — ABNORMAL LOW (ref 0.3–1.2)
Total Protein: 6 g/dL — ABNORMAL LOW (ref 6.5–8.1)

## 2019-04-09 LAB — GLUCOSE, CAPILLARY
Glucose-Capillary: 195 mg/dL — ABNORMAL HIGH (ref 70–99)
Glucose-Capillary: 207 mg/dL — ABNORMAL HIGH (ref 70–99)
Glucose-Capillary: 203 mg/dL — ABNORMAL HIGH (ref 70–99)
Glucose-Capillary: 217 mg/dL — ABNORMAL HIGH (ref 70–99)
Glucose-Capillary: 203 mg/dL — ABNORMAL HIGH (ref 70–99)

## 2019-04-09 LAB — C-REACTIVE PROTEIN: CRP: 10.2 mg/dL — ABNORMAL HIGH (ref <1.0)

## 2019-04-09 LAB — HEPATITIS B SURFACE ANTIGEN: Hepatitis B Surface Ag: NEGATIVE

## 2019-04-09 LAB — D-DIMER, QUANTITATIVE: D-Dimer, Quant: 0.62 ug/mL-FEU — ABNORMAL HIGH (ref 0.00–0.50)

## 2019-04-09 LAB — PHOSPHORUS: Phosphorus: 2.8 mg/dL (ref 2.5–4.6)

## 2019-04-09 LAB — FERRITIN: Ferritin: 762 ng/mL — ABNORMAL HIGH (ref 11–307)

## 2019-04-09 LAB — MAGNESIUM: Magnesium: 2.2 mg/dL (ref 1.7–2.4)

## 2019-04-09 LAB — INTERLEUKIN-6, PLASMA: Interleukin-6, Plasma: 14.8 pg/mL — ABNORMAL HIGH (ref 0.0–12.2)

## 2019-04-09 MED ORDER — PRO-STAT SUGAR FREE PO LIQD
30.0000 mL | Freq: Three times a day (TID) | ORAL | Status: DC
  Administered 2019-04-09 – 2019-04-10 (×3): 30 mL
  Filled 2019-04-09 (×3): qty 30

## 2019-04-09 MED ORDER — FUROSEMIDE 10 MG/ML IJ SOLN
40.0000 mg | Freq: Once | INTRAMUSCULAR | Status: AC
  Administered 2019-04-09: 11:00:00 40 mg via INTRAVENOUS
  Filled 2019-04-09: qty 4

## 2019-04-09 MED ORDER — TOCILIZUMAB 400 MG/20ML IV SOLN
550.0000 mg | Freq: Once | INTRAVENOUS | Status: AC
  Administered 2019-04-09: 550 mg via INTRAVENOUS
  Filled 2019-04-09: qty 10

## 2019-04-09 MED ORDER — INSULIN ASPART 100 UNIT/ML ~~LOC~~ SOLN
0.0000 [IU] | SUBCUTANEOUS | Status: DC
  Administered 2019-04-09 (×2): 5 [IU] via SUBCUTANEOUS
  Administered 2019-04-09: 04:00:00 3 [IU] via SUBCUTANEOUS
  Administered 2019-04-09 (×2): 5 [IU] via SUBCUTANEOUS
  Administered 2019-04-10: 3 [IU] via SUBCUTANEOUS
  Administered 2019-04-10 (×2): 2 [IU] via SUBCUTANEOUS
  Administered 2019-04-10 (×3): 5 [IU] via SUBCUTANEOUS
  Administered 2019-04-11: 2 [IU] via SUBCUTANEOUS
  Administered 2019-04-11 (×4): 3 [IU] via SUBCUTANEOUS
  Administered 2019-04-12 (×2): 2 [IU] via SUBCUTANEOUS
  Administered 2019-04-12 (×3): 5 [IU] via SUBCUTANEOUS
  Administered 2019-04-13: 12:00:00 3 [IU] via SUBCUTANEOUS
  Administered 2019-04-13: 2 [IU] via SUBCUTANEOUS
  Administered 2019-04-13: 3 [IU] via SUBCUTANEOUS
  Administered 2019-04-14: 1 [IU] via SUBCUTANEOUS

## 2019-04-09 MED ORDER — OSMOLITE 1.2 CAL PO LIQD
1000.0000 mL | ORAL | Status: DC
  Administered 2019-04-09: 1000 mL

## 2019-04-09 MED FILL — Sodium Chloride IV Soln 0.9%: INTRAVENOUS | Qty: 250 | Status: AC

## 2019-04-09 MED FILL — Fentanyl Citrate Preservative Free (PF) Inj 2500 MCG/50ML: INTRAMUSCULAR | Qty: 50 | Status: AC

## 2019-04-09 NOTE — Progress Notes (Signed)
NAME:  Joyce Mcguire, MRN:  [id number], DOB:  Mar 11, 1966, LOS: 2 ADMISSION DATE:  04/07/2019, CONSULTATION DATE: April 08, 2019 REFERRING MD: Dr. Jarvis Newcomer, CHIEF COMPLAINT: Dyspnea  Brief History   53 year old female with no past medical history admitted for severe acute respiratory failure with hypoxemia due to COVID-19 pneumonia.  History of present illness   This is a 53 year old non-smoking female who says she never had any prior lung history who presented to [facility] on April 07, 2019 after being diagnosed with COVID pneumonia.  She had been diagnosed with COVID approximately 5 days prior to her arrival at the [facility] emergency room on June 1 at which point she was complaining of shortness of breath.  On my history she was quite short of breath and unable to complete sentences so she was not able to give a detailed history so history is obtained by chart review.  She was admitted by the hospitalist service, administered steroids, Tocilizumab, and Remdesivir.  However despite these interventions her oxygen needs have been steadily increasing and she has become more fatigued, breathing faster, and complaining of more shortness of breath throughout the course of the day.  She was brought to the intensive care unit for closer monitoring.  She was initially started on heated high flow oxygen which was able to improve her oxygen levels but the patient continued to complain of worsening shortness of breath and accessory muscle use.  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  Past Medical History  "asthma" > patient says that she was never diagnosed with this prior to being diagnosed with COVID  Significant Hospital Events   6/1 admission 6/2 intubation  Consults:  Pulmonary and critical care medicine  Procedures:  6/1  ETT>  Significant Diagnostic Tests:    Micro Data:  May 26 SARS-CoV-2 positive 6/1 SARS-CoV-2 positive June 1 blood culture negative 6/2 RVP > negative  Antimicrobials:  6/1 remdesivir >  6/1, 6/3 Tocilizumab  Interim history/subjective:  Intubated yesterday. Consented for plasma  Objective   Blood pressure 115/66, pulse 80, temperature 99.3 F (37.4 C), temperature source Oral, resp. rate (!) 27, height 5' (1.524 m), weight 68.6 kg, SpO2 98 %.    Vent Mode: PRVC FiO2 (%):  [60 %-100 %] 60 % Set Rate:  [35 bmp] 35 bmp Vt Set:  [300 mL] 300 mL PEEP:  [10 cmH20-12 cmH20] 10 cmH20 Plateau Pressure:  [20 cmH20-25 cmH20] 22 cmH20   Intake/Output Summary (Last 24 hours) at 04/09/2019 1447 Last data filed at 04/09/2019 1300 Gross per 24 hour  Intake 1095.97 ml  Output 1970 ml  Net -874.03 ml   Filed Weights   04/07/19 1425 04/07/19 2003 04/09/19 0500  Weight: 68 kg 67.3 kg 68.6 kg   Physical Exam: General: Critically ill-appearing, sedated HENT: [place], AT, OP clear, MMM Eyes: EOMI, no scleral icterus Respiratory: Diminished breath sounds bilaterally  No crackles, wheezing or rales Cardiovascular: RRR, -M/R/G, no JVD GI: BS+, soft, nontender Extremities:-Edema,-tenderness Neuro: Sedated GU: Foley in place  Resolved Hospital Problem list     Assessment & Plan:  ARDS due to COVID-19: Severe hypoxemic respiratory failure Continue to titrate PEEP/FIO2 per ARDS ventilator protocol Continue Solu-Medrol D3 Continue Remdesivir D2 Complete 2nd dose of Actemra Consented for convalescent plasma Continue sedation protocol,  target RASS score -2 Scheduled nebulizer Diurese VAP Continue weight-based lovenox  Asthma: doubt Prn albuterol  Hyperglycemia SSI Trend CBG  Best practice:  Diet: start tube feeding Pain/Anxiety/Delirium protocol (if indicated): As above VAP protocol (if indicated): Yes DVT prophylaxis: Enoxaparin per COVID protocol GI prophylaxis: Pantoprazole  Glucose control: Sliding scale insulin Mobility: Bedrest Code Status: Full Family Communication: Per TRH Disposition: Remain in ICU  Labs   CBC: Recent Labs  Lab 04/07/19 1452 04/08/19 0420 04/08/19 1357 04/08/19 1558 04/08/19 1830 04/09/19 0140  WBC 7.8 6.0  --   --   --  7.3  NEUTROABS 7.1 5.6  --   --   --  6.5  HGB 12.9 12.4 11.6* 12.6 11.6* 11.8*  HCT 38.5 38.2 34.0* 37.0 34.0* 36.9  MCV 93.4 93.9  --   --   --  95.6  PLT 251 279  --   --   --  308    Basic Metabolic Panel: Recent Labs  Lab 04/07/19 1452 04/08/19 0420 04/08/19 1357 04/08/19 1558 04/08/19 1600 04/08/19 1830 04/09/19 0140  NA 137 141 143 143  --  143 141  K 3.2* 3.5 3.6 3.8  --  4.0 3.8  CL 105 110  --   --   --   --  113*  CO2 24 22  --   --   --   --  24  GLUCOSE 141* 182*  --   --   --   --  204*  BUN 6 8  --   --   --   --  20  CREATININE 0.59 0.57  --   --   --   --  0.61  CALCIUM 8.6* 8.2*  --   --   --   --  8.2*  MG  --  1.9  --   --  2.0  --  2.2  PHOS  --  2.8  --   --  4.3  --  2.8   GFR: Estimated Creatinine Clearance: 69.4 mL/min (by C-G formula based on SCr of 0.61 mg/dL). Recent Labs  Lab 04/07/19 1452 04/07/19 1500 04/07/19 1621 04/08/19 0420 04/09/19 0140  PROCALCITON  --  <0.10  --  <0.10  --   WBC 7.8  --   --  6.0 7.3  LATICACIDVEN  --   --  1.3  --   --     Liver Function Tests: Recent Labs  Lab 04/07/19 1452 04/08/19 0420 04/09/19 0140  AST 50* 50* 38  ALT 40 41 37  ALKPHOS 70 70 63  BILITOT 0.5 0.3 0.2*  PROT 7.4 6.6 6.0*  ALBUMIN 3.4* 2.8* 2.5*   No results for input(s): LIPASE, AMYLASE in the last 168 hours. No results for input(s): AMMONIA in the last 168 hours.  ABG    Component Value Date/Time   PHART 7.356 04/08/2019 1830   PCO2ART 44.1 04/08/2019 1830   PO2ART 211.0 (H) 04/08/2019 1830   HCO3 24.7 04/08/2019 1830   TCO2 26 04/08/2019 1830   ACIDBASEDEF 1.0 04/08/2019 1830   O2SAT 100.0 04/08/2019 1830     Coagulation Profile: No  results for input(s): INR, PROTIME in the last 168 hours.  Cardiac Enzymes: Recent Labs  Lab 04/08/19 0420  CKTOTAL 66  TROPONINI <0.03    HbA1C: No results found for: HGBA1C  CBG: Recent Labs  Lab 04/08/19 1954 04/08/19 2325 04/09/19 0335 04/09/19 0744 04/09/19 1144  GLUCAP 133* 173* 195* 207* 203*  Critical care time: 32 minutes    The patient is critically ill with multiple organ systems failure and requires high complexity decision making for assessment and support, frequent evaluation and titration of therapies, application of advanced monitoring technologies and extensive interpretation of multiple databases.   Discussed and co-managed patient care with PCCM-Hospitalist. Coordinated care with RT, RN and pharmacist.  Mechele Collin, M.D. [facility] Pulmonary/Critical Care Medicine Pager: [phone] After hours pager: [phone]

## 2019-04-09 NOTE — Progress Notes (Signed)
Spoke with pts daughter Janean Sark, updated on pt status and plan of care.

## 2019-04-09 NOTE — Progress Notes (Signed)
PROGRESS NOTE    Joyce Mcguire  LFY:[id number] DOB: June 20, 1966 DOA: 04/07/2019 PCP: System, Pcp Not In      Brief Narrative:  Joyce Mcguire is a 53 y.o. F with no significant PMHx who presented with 5 days cough, progressive SOB.  In the ER, SpO2 < 90%, required 4L [place].  CXR showed bilateral opacities, SARS-CoV-2 NAA positive.    Transferred to [facility], intubated on hospital day 2 due to tachypnea, fatigue, escalating O2 requirements.  Remdesivir started, Actemra and convalescent plasma given.     Assessment & Plan:  Coronavirus pneumonitis with acute hypoxic respiratory failure In setting of ongoing 2020 COVID-19 pandemic.  S/p Actemra 6/1.  S/p convalescent plasma 6/2.   CRP 20 --> 10 mg/dL.  Net positive fluid balance last 24 hours 800cc.    -Continue Remdesivir -Continue Solumedrol 40 q8h day 3 of 5 -VTE PPx with Lovenox, high dose -Continue Zinc and Vitamin C -Daily d-dimer, ferritin and CRP -Lasix IV once    COVID-19 Labs Recent Labs    04/07/19 1632 04/08/19 0420 04/09/19 0140  DDIMER 0.55* 0.41 0.62*  FERRITIN 568* 666* 762*  LDH 332* 324*  --   CRP 18.5* 20.1* 10.2*       Hyperglycemia No previous history diabetes. -Check A1c -If total 24 hrs insulin requirements steady, will start low dose Lantus       MDM and disposition: The below labs and imaging reports were reviewed and summarized above.  Medication management as above.  The patient was admitted with COVID-19 and acute hypoxic respiratory failure.    The patient is critically ill with multi-organ failure.  Critical care was necessary to treat or prevent imminent or life-threatening deterioration of sepsis, respiratory failure, and was exclusive of separately billable procedures and treating other patients. Total critical care time spent by me: 50 minutes Time spent personally by me on obtaining history from patient or surrogate, evaluation of the patient, evaluation of patient's response  to treatment, ordering and review of laboratory studies, ordering and review of radiographic studies, ordering and performing treatments and interventions, and re-evaluation of the patient's condition.      DVT prophylaxis: Lovenox Code Status: FULL Family Communication: Daughter by phone    Consultants:   PCCM  Procedures:   ETT 6/2  Antimicrobials:   Remdesivir 6/1 >>    Culture data:   6/1 blood culture x1 -- NGTD  6/1 RVP -- Negative  6/2 Urine culture -- NGTD  MRSA nares -- Neg   Subjective: Intubated and sedated.  Able to wean sedation this morning.    Objective: Vitals:   04/09/19 1000 04/09/19 1100 04/09/19 1215 04/09/19 1300  BP: 115/72 114/69 115/70 115/66  Pulse: 74 82 72 80  Resp: (!) 29 (!) 32 (!) 35 (!) 27  Temp:    99.3 F (37.4 C)  TempSrc:    Oral  SpO2: 99% 98% 98% 98%  Weight:      Height:        Intake/Output Summary (Last 24 hours) at 04/09/2019 1434 Last data filed at 04/09/2019 1300 Gross per 24 hour  Intake 1095.97 ml  Output 1970 ml  Net -874.03 ml   Filed Weights   04/07/19 1425 04/07/19 2003 04/09/19 0500  Weight: 68 kg 67.3 kg 68.6 kg    Examination: General appearance: well nourished adult female  Intubated and sedated   HEENT: Anicteric, conjunctiva normal, lids and lashes normal. No nasal deformity, discharge, epistaxis. ETT in place  Skin: Warm and dry.  No jaundice.  No suspicious rashes or lesions. Cardiac: RRR, nl S1-S2, no murmurs appreciated.  Capillary refill is brisk.  JVP normal  No LE edema.  Radia  pulses 2+ and symmetric. Respiratory: Intubated, ETT in place, lung sounds diminished Abdomen: Abdomen soft.  no TTP. No ascites, distension, hepatosplenomegaly.   Neuro/Psych: intubated and sedated.  Pupils equal. Follows commands, opens eyes to voice     Data Reviewed: I have personally reviewed following labs and imaging studies:  CBC: Recent Labs  Lab 04/07/19 1452 04/08/19 0420 04/08/19 1357  04/08/19 1558 04/08/19 1830 04/09/19 0140  WBC 7.8 6.0  --   --   --  7.3  NEUTROABS 7.1 5.6  --   --   --  6.5  HGB 12.9 12.4 11.6* 12.6 11.6* 11.8*  HCT 38.5 38.2 34.0* 37.0 34.0* 36.9  MCV 93.4 93.9  --   --   --  95.6  PLT 251 279  --   --   --  585   Basic Metabolic Panel: Recent Labs  Lab 04/07/19 1452 04/08/19 0420 04/08/19 1357 04/08/19 1558 04/08/19 1600 04/08/19 1830 04/09/19 0140  NA 137 141 143 143  --  143 141  K 3.2* 3.5 3.6 3.8  --  4.0 3.8  CL 105 110  --   --   --   --  113*  CO2 24 22  --   --   --   --  24  GLUCOSE 141* 182*  --   --   --   --  204*  BUN 6 8  --   --   --   --  20  CREATININE 0.59 0.57  --   --   --   --  0.61  CALCIUM 8.6* 8.2*  --   --   --   --  8.2*  MG  --  1.9  --   --  2.0  --  2.2  PHOS  --  2.8  --   --  4.3  --  2.8   GFR: Estimated Creatinine Clearance: 69.4 mL/min (by C-G formula based on SCr of 0.61 mg/dL). Liver Function Tests: Recent Labs  Lab 04/07/19 1452 04/08/19 0420 04/09/19 0140  AST 50* 50* 38  ALT 40 41 37  ALKPHOS 70 70 63  BILITOT 0.5 0.3 0.2*  PROT 7.4 6.6 6.0*  ALBUMIN 3.4* 2.8* 2.5*   No results for input(s): LIPASE, AMYLASE in the last 168 hours. No results for input(s): AMMONIA in the last 168 hours. Coagulation Profile: No results for input(s): INR, PROTIME in the last 168 hours. Cardiac Enzymes: Recent Labs  Lab 04/08/19 0420  CKTOTAL 66  TROPONINI <0.03   BNP (last 3 results) No results for input(s): PROBNP in the last 8760 hours. HbA1C: No results for input(s): HGBA1C in the last 72 hours. CBG: Recent Labs  Lab 04/08/19 1954 04/08/19 2325 04/09/19 0335 04/09/19 0744 04/09/19 1144  GLUCAP 133* 173* 195* 207* 203*   Lipid Profile: Recent Labs    04/07/19 1632 04/08/19 0420  CHOL  --  119  HDL  --  59  LDLCALC  --  45  TRIG 91 76  CHOLHDL  --  2.0   Thyroid Function Tests: No results for input(s): TSH, T4TOTAL, FREET4, T3FREE, THYROIDAB in the last 72 hours. Anemia  Panel: Recent Labs    04/08/19 0420 04/09/19 0140  FERRITIN 666* 762*   Urine analysis:  Component Value Date/Time   COLORURINE YELLOW 04/08/2019 0830   APPEARANCEUR HAZY (A) 04/08/2019 0830   LABSPEC 1.023 04/08/2019 0830   PHURINE 6.0 04/08/2019 0830   GLUCOSEU NEGATIVE 04/08/2019 0830   HGBUR NEGATIVE 04/08/2019 0830   BILIRUBINUR NEGATIVE 04/08/2019 0830   KETONESUR NEGATIVE 04/08/2019 0830   PROTEINUR 30 (A) 04/08/2019 0830   NITRITE POSITIVE (A) 04/08/2019 0830   LEUKOCYTESUR MODERATE (A) 04/08/2019 0830   Sepsis Labs: @LABRCNTIP (procalcitonin:4,lacticacidven:4)  ) Recent Results (from the past 240 hour(s))  SARS Coronavirus 2 (CEPHEID- Performed in [place] hospital lab), Hosp Order     Status: Abnormal   Collection Time: 04/07/19  2:53 PM  Result Value Ref Range Status   SARS Coronavirus 2 POSITIVE (A) NEGATIVE Final    Comment: RESULT CALLED TO, READ BACK BY AND VERIFIED WITH: Ethelda Chick [id number] @ 1700 BY J SCOTTON (NOTE) If result is NEGATIVE SARS-CoV-2 target nucleic acids are NOT DETECTED. The SARS-CoV-2 RNA is generally detectable in upper and lower  respiratory specimens during the acute phase of infection. The lowest  concentration of SARS-CoV-2 viral copies this assay can detect is 250  copies / mL. A negative result does not preclude SARS-CoV-2 infection  and should not be used as the sole basis for treatment or other  patient management decisions.  A negative result may occur with  improper specimen collection / handling, submission of specimen other  than nasopharyngeal swab, presence of viral mutation(s) within the  areas targeted by this assay, and inadequate number of viral copies  (<250 copies / mL). A negative result must be combined with clinical  observations, patient history, and epidemiological information. If result is POSITIVE SARS-CoV-2 target nucleic acids are DETEC TED. The SARS-CoV-2 RNA is generally detectable in upper and  lower  respiratory specimens during the acute phase of infection.  Positive  results are indicative of active infection with SARS-CoV-2.  Clinical  correlation with patient history and other diagnostic information is  necessary to determine patient infection status.  Positive results do  not rule out bacterial infection or co-infection with other viruses. If result is PRESUMPTIVE POSTIVE SARS-CoV-2 nucleic acids MAY BE PRESENT.   A presumptive positive result was obtained on the submitted specimen  and confirmed on repeat testing.  While 2019 novel coronavirus  (SARS-CoV-2) nucleic acids may be present in the submitted sample  additional confirmatory testing may be necessary for epidemiological  and / or clinical management purposes  to differentiate between  SARS-CoV-2 and other Sarbecovirus currently known to infect humans.  If clinically indicated additional testing with an alternate test  methodology (LAB7 453) is advised. The SARS-CoV-2 RNA is generally  detectable in upper and lower respiratory specimens during the acute  phase of infection. The expected result is Negative. Fact Sheet for Patients:  StrictlyIdeas.no Fact Sheet for Healthcare Providers: BankingDealers.co.za This test is not yet approved or cleared by the [place] FDA and has been authorized for detection and/or diagnosis of SARS-CoV-2 by FDA under an Emergency Use Authorization (EUA).  This EUA will remain in effect (meaning this test can be used) for the duration of the COVID-19 declaration under Section 564(b)(1) of the Act, 21 U.S.C. section 360bbb-3(b)(1), unless the authorization is terminated or revoked sooner. Performed at [facility], [place] [address], [place], [place] [address]   Blood culture (routine x 2)     Status: None (Preliminary result)   Collection Time: 04/07/19  4:21 PM  Result Value Ref Range Status  Specimen  Description   Final    BLOOD RIGHT HAND Performed at [facility] [address], [place], [place] [address]    Special Requests   Final    BOTTLES DRAWN AEROBIC ONLY Blood Culture adequate volume Performed at [facility] [address] [place], [place] [address]    Culture   Final    NO GROWTH 2 DAYS Performed at [facility] [address], [place], [place] [address]    Report Status PENDING  Incomplete  Blood culture (routine x 2)     Status: None (Preliminary result)   Collection Time: 04/07/19  4:26 PM  Result Value Ref Range Status   Specimen Description   Final    BLOOD RIGHT HAND Performed at [facility] [address], [place], [place] [address]    Special Requests   Final    BOTTLES DRAWN AEROBIC ONLY Blood Culture adequate volume Performed at [facility] [address], [place], [place] [address]    Culture   Final    NO GROWTH 2 DAYS Performed at [facility] [address] [place], [place] [address]    Report Status PENDING  Incomplete  MRSA PCR Screening     Status: None   Collection Time: 04/07/19 11:58 PM  Result Value Ref Range Status   MRSA by PCR NEGATIVE NEGATIVE Final    Comment:        The GeneXpert MRSA Assay (FDA approved for NASAL specimens only), is one component of a comprehensive MRSA colonization surveillance program. It is not intended to diagnose MRSA infection nor to guide or monitor treatment for MRSA infections. Performed at [facility], [place] [address] [place], [place] [address]   Respiratory Panel by PCR     Status: None   Collection Time: 04/08/19  8:45 AM  Result Value Ref Range Status   Adenovirus NOT DETECTED NOT DETECTED Final   Coronavirus 229E NOT DETECTED NOT DETECTED Final    Comment: (NOTE) The Coronavirus on the Respiratory Panel, DOES NOT test for the novel  Coronavirus (2019 nCoV)    Coronavirus  HKU1 NOT DETECTED NOT DETECTED Final   Coronavirus NL63 NOT DETECTED NOT DETECTED Final   Coronavirus OC43 NOT DETECTED NOT DETECTED Final   Metapneumovirus NOT DETECTED NOT DETECTED Final   Rhinovirus / Enterovirus NOT DETECTED NOT DETECTED Final   Influenza A NOT DETECTED NOT DETECTED Final   Influenza B NOT DETECTED NOT DETECTED Final   Parainfluenza Virus 1 NOT DETECTED NOT DETECTED Final   Parainfluenza Virus 2 NOT DETECTED NOT DETECTED Final   Parainfluenza Virus 3 NOT DETECTED NOT DETECTED Final   Parainfluenza Virus 4 NOT DETECTED NOT DETECTED Final   Respiratory Syncytial Virus NOT DETECTED NOT DETECTED Final   Bordetella pertussis NOT DETECTED NOT DETECTED Final   Chlamydophila pneumoniae NOT DETECTED NOT DETECTED Final   Mycoplasma pneumoniae NOT DETECTED NOT DETECTED Final    Comment: Performed at [facility], [place]. [address], [place], [place] [address]         Radiology Studies: Dg Abd 1 View  Result Date: 04/08/2019 CLINICAL DATA:  OG tube placement. EXAM: ABDOMEN - 1 VIEW COMPARISON:  None. FINDINGS: OG tube is in place with the tip in the distal stomach or first portion of the duodenum. IMPRESSION: As above. Electronically Signed   By: Inge Rise M.D.   On: 04/08/2019 15:05   Dg Chest  Port 1 View  Result Date: 04/08/2019 CLINICAL DATA:  Multifocal pneumonia.  COVID Positive. EXAM: PORTABLE CHEST 1 VIEW COMPARISON:  04/07/2019 FINDINGS: Endotracheal tube tip is 11 mm above the carina. NG tube tip is in the distal stomach or duodenal bulb. The patient has extensive bilateral hazy pulmonary infiltrates which appear less prominent than on the prior study but the aeration is improved in both lungs as compared to the prior exam. Heart size and vascularity are normal. No effusions. Bones are normal. IMPRESSION: 1. Extensive bilateral pulmonary infiltrates consistent with the patient's history of COVID-19. 2. Endotracheal tube is 11 mm above the carina and could be  slightly retracted. Electronically Signed   By: Lorriane Shire M.D.   On: 04/08/2019 15:06   Dg Chest Port 1 View  Result Date: 04/07/2019 CLINICAL DATA:  Shortness of breath since last Friday, cough, chest pain, history asthma, reported COVID-19 positive test at another hospital EXAM: PORTABLE CHEST 1 VIEW COMPARISON:  Portable exam 1455 hours without priors for comparison FINDINGS: Upper normal heart size. Normal mediastinal contours. Patchy BILATERAL pulmonary infiltrates likely infectious. No definite pleural effusion or pneumothorax. Osseous structures unremarkable. IMPRESSION: Patchy BILATERAL pulmonary infiltrates consistent with pneumonia, including atypical etiologies. Electronically Signed   By: Lavonia Dana M.D.   On: 04/07/2019 15:34        Scheduled Meds:  chlorhexidine gluconate (MEDLINE KIT)  15 mL Mouth Rinse BID   Chlorhexidine Gluconate Cloth  6 each Topical Q0600   enoxaparin (LOVENOX) injection  40 mg Subcutaneous Q12H   feeding supplement (OSMOLITE 1.2 CAL)  1,000 mL Per Tube Q24H   feeding supplement (PRO-STAT SUGAR FREE 64)  30 mL Per Tube TID   insulin aspart  0-15 Units Subcutaneous Q4H   mouth rinse  15 mL Mouth Rinse 10 times per day   methylPREDNISolone (SOLU-MEDROL) injection  40 mg Intravenous Q8H   pantoprazole sodium  40 mg Per Tube Q1200   vitamin C  500 mg Oral Daily   zinc sulfate  220 mg Oral Daily   Continuous Infusions:  sodium chloride 10 mL/hr at 04/09/19 1100   fentaNYL infusion INTRAVENOUS 200 mcg/hr (04/09/19 1100)   remdesivir 100 mg in NS 250 mL 100 mg (04/08/19 2308)     LOS: 2 days    Time spent: 50  During this encounter: Patient Isolation: Airborne, contact, droplet HCP PPE: Face shield, head covering, N95, gown, gloves, and shoe covers    Edwin Dada, MD [facility] 04/09/2019, 2:34 PM     Please page through [place]:  [email or web address] Password [email or web address] If 7PM-7AM, please contact night-coverage

## 2019-04-09 NOTE — Progress Notes (Signed)
Pt daughter called for update on patient. No changes to report to family. All questions ansered at this time.

## 2019-04-09 NOTE — Progress Notes (Signed)
Initial Nutrition Assessment   RD working remotely.   DOCUMENTATION CODES:   Not applicable  INTERVENTION:   Tube Feeding:  Osmolite 1.2 at 40 ml/hr Pro-Stat 30 mL TID Provides 98 g of protein, 1452 kcals, 778 ml of free water Meets 100% estimated calorie an dprotein needs   NUTRITION DIAGNOSIS:   Inadequate oral intake related to acute illness as evidenced by NPO status.  GOAL:   Patient will meet greater than or equal to 90% of their needs  MONITOR:   Vent status, TF tolerance, Labs, Weight trends, Skin  REASON FOR ASSESSMENT:   Consult, Ventilator Enteral/tube feeding initiation and management  ASSESSMENT:   53 yo female with no PMH admitted with severe acute respiratory failure due to COVID-19 pneumonia.   Patient is currently intubated on ventilator support MV: 9.8 L/min Temp (24hrs), Avg:98.1 F (36.7 C), Min:97.4 F (36.3 C), Max:98.8 F (37.1 C)  OG tube with tip in distal stomach vs first portion of duodenum  Admission wt 67.3 kg; current wt 68.6 kg  Labs: reviewed Meds: ss novolog, solumedrol, Vit C, zinc  NUTRITION - FOCUSED PHYSICAL EXAM:  Unable to assess, working remotely  Diet Order:   Diet Order            Diet NPO time specified  Diet effective now              EDUCATION NEEDS:   Not appropriate for education at this time  Skin:  Skin Assessment: Reviewed RN Assessment  Last BM:  6/03  Height:   Ht Readings from Last 1 Encounters:  04/08/19 5' (1.524 m)    Weight:   Wt Readings from Last 1 Encounters:  04/09/19 68.6 kg    Ideal Body Weight:  45.5 kg  BMI:  Body mass index is 29.55 kg/m.  Estimated Nutritional Needs:   Kcal:  1438 kcals   Protein:  95-125 g   Fluid:  >/= 1.4 L    [name] MS, RD, LDN, CNSC [phone] Pager  [phone] Weekend/On-Call Pager

## 2019-04-10 DIAGNOSIS — Z978 Presence of other specified devices: Secondary | ICD-10-CM

## 2019-04-10 DIAGNOSIS — J9601 Acute respiratory failure with hypoxia: Secondary | ICD-10-CM

## 2019-04-10 LAB — COMPREHENSIVE METABOLIC PANEL
ALT: 37 U/L (ref 0–44)
AST: 34 U/L (ref 15–41)
Albumin: 2.6 g/dL — ABNORMAL LOW (ref 3.5–5.0)
Alkaline Phosphatase: 69 U/L (ref 38–126)
Anion gap: 7 (ref 5–15)
BUN: 30 mg/dL — ABNORMAL HIGH (ref 6–20)
CO2: 28 mmol/L (ref 22–32)
Calcium: 8.4 mg/dL — ABNORMAL LOW (ref 8.9–10.3)
Chloride: 113 mmol/L — ABNORMAL HIGH (ref 98–111)
Creatinine, Ser: 0.66 mg/dL (ref 0.44–1.00)
GFR calc Af Amer: >60 mL/min (ref >60)
GFR calc non Af Amer: >60 mL/min (ref >60)
Glucose, Bld: 213 mg/dL — ABNORMAL HIGH (ref 70–99)
Potassium: 3.7 mmol/L (ref 3.5–5.1)
Sodium: 148 mmol/L — ABNORMAL HIGH (ref 135–145)
Total Bilirubin: 0.2 mg/dL — ABNORMAL LOW (ref 0.3–1.2)
Total Protein: 6 g/dL — ABNORMAL LOW (ref 6.5–8.1)

## 2019-04-10 LAB — CBC WITH DIFFERENTIAL/PLATELET
Abs Immature Granulocytes: 0.22 10*3/uL — ABNORMAL HIGH (ref 0.00–0.07)
Basophils Absolute: 0 10*3/uL (ref 0.0–0.1)
Basophils Relative: 0 %
Eosinophils Absolute: 0 10*3/uL (ref 0.0–0.5)
Eosinophils Relative: 0 %
HCT: 36.9 % (ref 36.0–46.0)
Hemoglobin: 11.8 g/dL — ABNORMAL LOW (ref 12.0–15.0)
Immature Granulocytes: 2 %
Lymphocytes Relative: 5 %
Lymphs Abs: 0.6 10*3/uL — ABNORMAL LOW (ref 0.7–4.0)
MCH: 31.1 pg (ref 26.0–34.0)
MCHC: 32 g/dL (ref 30.0–36.0)
MCV: 97.1 fL (ref 80.0–100.0)
Monocytes Absolute: 0.6 10*3/uL (ref 0.1–1.0)
Monocytes Relative: 5 %
Neutro Abs: 11 10*3/uL — ABNORMAL HIGH (ref 1.7–7.7)
Neutrophils Relative %: 88 %
Platelets: 346 10*3/uL (ref 150–400)
RBC: 3.8 MIL/uL — ABNORMAL LOW (ref 3.87–5.11)
RDW: 13.2 % (ref 11.5–15.5)
WBC: 12.4 10*3/uL — ABNORMAL HIGH (ref 4.0–10.5)
nRBC: 0 % (ref 0.0–0.2)

## 2019-04-10 LAB — GLUCOSE, CAPILLARY
Glucose-Capillary: 127 mg/dL — ABNORMAL HIGH (ref 70–99)
Glucose-Capillary: 142 mg/dL — ABNORMAL HIGH (ref 70–99)
Glucose-Capillary: 174 mg/dL — ABNORMAL HIGH (ref 70–99)
Glucose-Capillary: 214 mg/dL — ABNORMAL HIGH (ref 70–99)
Glucose-Capillary: 223 mg/dL — ABNORMAL HIGH (ref 70–99)
Glucose-Capillary: 224 mg/dL — ABNORMAL HIGH (ref 70–99)

## 2019-04-10 LAB — BPAM FFP
Blood Product Expiration Date: 202006040326
ISSUE DATE / TIME: 202006030328
Unit Type and Rh: 5100

## 2019-04-10 LAB — PREPARE FRESH FROZEN PLASMA

## 2019-04-10 LAB — URINE CULTURE: Culture: >=100000 — AB

## 2019-04-10 LAB — C-REACTIVE PROTEIN: CRP: 4.1 mg/dL — ABNORMAL HIGH (ref <1.0)

## 2019-04-10 LAB — FERRITIN: Ferritin: 565 ng/mL — ABNORMAL HIGH (ref 11–307)

## 2019-04-10 LAB — D-DIMER, QUANTITATIVE: D-Dimer, Quant: 0.44 ug/mL-FEU (ref 0.00–0.50)

## 2019-04-10 MED ORDER — FUROSEMIDE 10 MG/ML IJ SOLN
40.0000 mg | Freq: Once | INTRAMUSCULAR | Status: AC
  Administered 2019-04-10: 40 mg via INTRAVENOUS
  Filled 2019-04-10: qty 4

## 2019-04-10 MED ORDER — HALOPERIDOL LACTATE 5 MG/ML IJ SOLN
2.0000 mg | Freq: Four times a day (QID) | INTRAMUSCULAR | Status: DC | PRN
  Administered 2019-04-10 – 2019-04-12 (×4): 2 mg via INTRAVENOUS
  Filled 2019-04-10 (×5): qty 1

## 2019-04-10 MED ORDER — DEXMEDETOMIDINE HCL IN NACL 400 MCG/100ML IV SOLN
0.4000 ug/kg/h | INTRAVENOUS | Status: DC
  Administered 2019-04-10: 10:00:00 0.8 ug/kg/h via INTRAVENOUS
  Filled 2019-04-10: qty 100

## 2019-04-10 MED ORDER — POTASSIUM CHLORIDE 20 MEQ/15ML (10%) PO SOLN
20.0000 meq | ORAL | Status: AC
  Administered 2019-04-10 (×2): 20 meq
  Filled 2019-04-10 (×2): qty 15

## 2019-04-10 MED ORDER — FREE WATER: 100.0000 mL | Freq: Three times a day (TID) | Status: DC

## 2019-04-10 MED ORDER — LORAZEPAM 2 MG/ML IJ SOLN: 0.5000 mg | Freq: Four times a day (QID) | INTRAMUSCULAR | Status: DC | PRN

## 2019-04-10 MED ORDER — INSULIN GLARGINE 100 UNIT/ML ~~LOC~~ SOLN
12.0000 [IU] | Freq: Every day | SUBCUTANEOUS | Status: DC
  Administered 2019-04-10 – 2019-04-19 (×10): 12 [IU] via SUBCUTANEOUS
  Filled 2019-04-10 (×10): qty 0.12

## 2019-04-10 MED ORDER — ALPRAZOLAM 0.5 MG PO TABS
1.0000 mg | ORAL_TABLET | Freq: Three times a day (TID) | ORAL | Status: DC | PRN
  Administered 2019-04-16 – 2019-04-19 (×4): 1 mg via ORAL
  Filled 2019-04-10 (×5): qty 2

## 2019-04-10 NOTE — Progress Notes (Signed)
Spoke w/daughter. Suicidal thoughts started before hospitalization. Stated she was going to die soon. Pt sisters also spoke of pt's depression. Daughter has trusted friend planning to talk to pt between 1 and 2 tomorrow.

## 2019-04-10 NOTE — Progress Notes (Signed)
NAME:  Joyce SlickMaria Mcguire, MRN:  [id number], DOB:  04-25-1966, LOS: 3 ADMISSION DATE:  04/07/2019, CONSULTATION DATE: April 08, 2019 REFERRING MD: Dr. Jarvis NewcomerGrunz, CHIEF COMPLAINT: Dyspnea  Brief History   53 year old female with no past medical history admitted for severe acute respiratory failure with hypoxemia due to COVID-19 pneumonia.  History of present illness   This is a 53 year old non-smoking female who says she never had any prior lung history who presented to [facility] on April 07, 2019 after being diagnosed with COVID pneumonia.  She had been diagnosed with COVID approximately 5 days prior to her arrival at the [facility] emergency room on June 1 at which point she was complaining of shortness of breath.  On my history she was quite short of breath and unable to complete sentences so she was not able to give a detailed history so history is obtained by chart review.  She was admitted by the hospitalist service, administered steroids, Tocilizumab, and Remdesivir.  However despite these interventions her oxygen needs have been steadily increasing and she has become more fatigued, breathing faster, and complaining of more shortness of breath throughout the course of the day.  She was brought to the intensive care unit for closer monitoring.  She was initially started on heated high flow oxygen which was able to improve her oxygen levels but the patient continued to complain of worsening shortness of breath and accessory muscle use.  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  Past Medical History  "asthma" > patient says that she was never diagnosed with this prior to being diagnosed with COVID  Significant Hospital Events   6/1 admission 6/2 intubation  Consults:  Pulmonary and critical care medicine  Procedures:  6/1  ETT>  Significant Diagnostic Tests:    Micro Data:  May 26 SARS-CoV-2 positive 6/1 SARS-CoV-2 positive June 1 blood culture negative 6/2 RVP > negative  Antimicrobials:  6/1 remdesivir >  6/1, 6/3 Tocilizumab 6/2 Convalescent plasma  Interim history/subjective:  Tolerating PS >3 hours. Following commands  Objective   Blood pressure 129/79, pulse 84, temperature 99.4 F (37.4 C), temperature source Oral, resp. rate 11, height 5' (1.524 m), weight 64.8 kg, SpO2 91 %.    Vent Mode: PRVC FiO2 (%):  [40 %-60 %] 40 % Set Rate:  [35 bmp] 35 bmp Vt Set:  [300 mL] 300 mL PEEP:  [8 cmH20-10 cmH20] 8 cmH20 Plateau Pressure:  [17 cmH20-22 cmH20] 20 cmH20   Intake/Output Summary (Last 24 hours) at 04/10/2019 1045 Last data filed at 04/10/2019 0900 Gross per 24 hour  Intake 1737.39 ml  Output 2040 ml  Net -302.61 ml   Filed Weights   04/07/19 2003 04/09/19 0500 04/10/19 0400  Weight: 67.3 kg 68.6 kg 64.8 kg   Physical Exam: General: Critically ill-appearing, awake and following commands HENT: [place], AT, ETT in place Eyes: EOMI, no scleral icterus Respiratory: Clear to auscultation bilaterally.  No crackles, wheezing or rales Cardiovascular: RRR, -M/R/G, no JVD GI: BS+, soft, nontender Extremities:-Edema,-tenderness Neuro: AAO x4, CNII-XII grossly intact, moves extremities x 4 GU: Foley in place  Resolved Hospital Problem list     Assessment & Plan:   ARDS due to COVID-19: Severe hypoxemic respiratory failure Extubate to HFNC. Monitor respiratory status for high risk for re-intubation  Completed Actemra S/p plasma 6/2 Continue Solu-Medrol D4 Continue Remdesivir D3 Scheduled nebulizer Lasix 40 mg VAP Continue weight-based lovenox  Sedation on mechanical ventilation Transition fentanyl gtt to precedex gtt PRN fentanyl and versed as needed Goal RASS 0 and -1  Hyperglycemia SSI Trend CBG  Hypernatremia Free water flushes per Dietary  Best practice:  Diet: Hold TF  Pain/Anxiety/Delirium protocol (if indicated): As above VAP protocol (if indicated): Yes DVT prophylaxis: Enoxaparin per COVID protocol GI prophylaxis: Pantoprazole Glucose control: Sliding scale insulin Mobility: Bedrest Code Status: Full Family Communication: Per TRH Disposition: Remain in ICU  Labs   CBC: Recent Labs  Lab 04/07/19 1452 04/08/19 0420 04/08/19 1357 04/08/19 1558 04/08/19 1830 04/09/19 0140 04/10/19 0433  WBC 7.8 6.0  --   --   --  7.3 12.4*  NEUTROABS 7.1 5.6  --   --   --  6.5 11.0*  HGB 12.9 12.4 11.6* 12.6 11.6* 11.8* 11.8*  HCT 38.5 38.2 34.0* 37.0 34.0* 36.9 36.9  MCV 93.4 93.9  --   --   --  95.6 97.1  PLT 251 279  --   --   --  308 346    Basic Metabolic Panel: Recent Labs  Lab 04/07/19 1452 04/08/19 0420 04/08/19 1357 04/08/19 1558 04/08/19 1600 04/08/19 1830 04/09/19 0140 04/10/19 0433  NA 137 141 143 143  --  143 141 148*  K 3.2* 3.5 3.6 3.8  --  4.0 3.8 3.7  CL 105 110  --   --   --   --  113* 113*  CO2 24 22  --   --   --   --  24 28  GLUCOSE 141* 182*  --   --   --   --  204* 213*  BUN 6 8  --   --   --   --  20 30*  CREATININE 0.59 0.57  --   --   --   --  0.61 0.66  CALCIUM 8.6* 8.2*  --   --   --   --  8.2* 8.4*  MG  --  1.9  --   --  2.0  --  2.2  --   PHOS  --  2.8  --   --  4.3  --  2.8  --    GFR: Estimated Creatinine Clearance: 67.5 mL/min (by C-G formula based on SCr of 0.66 mg/dL). Recent Labs  Lab 04/07/19 1452 04/07/19 1500 04/07/19 1621 04/08/19 0420 04/09/19 0140 04/10/19 0433  PROCALCITON  --  <0.10  --  <0.10  --   --   WBC 7.8  --   --  6.0 7.3 12.4*  LATICACIDVEN  --   --  1.3  --   --   --     Liver Function Tests: Recent Labs  Lab 04/07/19 1452 04/08/19 0420 04/09/19 0140 04/10/19 0433  AST 50* 50* 38 34  ALT 40 41 37 37  ALKPHOS 70 70 63 69  BILITOT 0.5 0.3 0.2* 0.2*  PROT 7.4 6.6 6.0* 6.0*  ALBUMIN 3.4* 2.8* 2.5* 2.6*   No results for input(s): LIPASE, AMYLASE in the last 168 hours.  No results for input(s): AMMONIA in the last 168 hours.  ABG    Component Value Date/Time   PHART 7.356 04/08/2019 1830   PCO2ART 44.1 04/08/2019 1830   PO2ART 211.0 (H) 04/08/2019 1830   HCO3 24.7 04/08/2019 1830   TCO2 26 04/08/2019 1830   ACIDBASEDEF 1.0 04/08/2019  1830   O2SAT 100.0 04/08/2019 1830     Coagulation Profile: No results for input(s): INR, PROTIME in the last 168 hours.  Cardiac Enzymes: Recent Labs  Lab 04/08/19 0420  CKTOTAL 66  TROPONINI <0.03    HbA1C: No results found for: HGBA1C  CBG: Recent Labs  Lab 04/09/19 1530 04/09/19 1952 04/09/19 2354 04/10/19 0414 04/10/19 0757  GLUCAP 217* 203* 224* 214* 174*   Critical care time: 36 minutes    The patient is critically ill with multiple organ systems failure and requires high complexity decision making for assessment and support, frequent evaluation and titration of therapies, application of advanced monitoring technologies and extensive interpretation of multiple databases.   Discussed and co-managed patient care with PCCM-Hospitalist. Coordinated care with RT, RN and pharmacist.  Mechele Collin, M.D. [facility] Pulmonary/Critical Care Medicine Pager: [phone] After hours pager: [phone]

## 2019-04-10 NOTE — Procedures (Signed)
Extubation Procedure Note  Patient Details:   Name: Joyce Mcguire DOB: 1966-09-16 MRN: [id number]   Airway Documentation:    Vent end date: 04/10/19 Vent end time: 1400   Evaluation  O2 sats: stable throughout Complications: No apparent complications Patient did tolerate procedure well. Bilateral Breath Sounds: Diminished   Yes   Extubated to 10L HFNC, Increased to 15L HFNC post extubation due to low spo2. Pt had positive cuff leak prior to extubation. No stridor noted, Bilateral Diminished BS, Pt able to speak and cough but won't open her eyes just says "im tired". VS within normal limits on 15L HFNC. RT will wean as tolerated. RT will continue to closely monitor pt  Carolan Shiver 04/10/2019, 2:19 PM

## 2019-04-10 NOTE — Progress Notes (Signed)
Spoke w/Dr. Flossie Dibble. States that Ativan should be d/c ed and add 1mg  PO Xanax Q8hr PRN with one dose now. Also states he wants psych consult entered now and if Psych could consult tonight that would be even better.

## 2019-04-10 NOTE — Progress Notes (Signed)
[facility] ADULT ICU REPLACEMENT PROTOCOL FOR AM LAB REPLACEMENT ONLY  The patient does apply for the [facility] Adult ICU Electrolyte Replacment Protocol based on the criteria listed below:   1. Is GFR >/= 40 ml/min? Yes.    Patient's GFR today is >60 2. Is urine output >/= 0.5 ml/kg/hr for the last 6 hours? Yes.   Patient's UOP is 1 ml/kg/hr 3. Is BUN < 60 mg/dL? Yes.    Patient's BUN today is 30 4. Abnormal electrolyte(s): K-3.7 5. Ordered repletion with: per protocol 6. If a panic level lab has been reported, has the CCM MD in charge been notified? Yes.  .   Physician:  Dr. Janne Lab, Manha Velten 04/10/2019 6:34 AM

## 2019-04-10 NOTE — Progress Notes (Signed)
Pt tolerating sips of water

## 2019-04-10 NOTE — Progress Notes (Addendum)
Pt crying over and over that she wants to die, why didn't we let her die, in spanish and english. Pt across from her has prayed with her and tried to reason with her in spanish. Notified charge nurse and performed suicide assessment. Calling patients daughter next. Pt refusing care, testing (CBG) and oxygen.

## 2019-04-10 NOTE — Progress Notes (Addendum)
PROGRESS NOTE    Kristelle Cavallaro  IRC:[id number] DOB: [birth year] DOA: 04/07/2019 PCP: System, Pcp Not In      Brief Narrative:  Mrs. Xie is a 53 y.o. F with no significant PMHx who presented with 5 days cough, progressive SOB.  In the ER, SpO2 < 90%, required 4L [place].  CXR showed bilateral opacities, SARS-CoV-2 NAA positive.    Transferred to [facility], intubated on hospital day 2 due to tachypnea, fatigue, escalating O2 requirements.  Remdesivir started, Actemra and convalescent plasma given.     Assessment & Plan:  Coronavirus pneumonitis with acute hypoxic respiratory failure In setting of ongoing 2020 COVID-19 pandemic.  S/p Actemra 6/1.  S/p convalescent plasma 6/2.   Inflammatory markers improving.  Net even last 24 hours.   -Continue Remdesivir day 4 of 5 -Continue Solumedrol 40 q8h day 4 of 5 -Continue VTE PPx with Lovenox, high dose -Continue Zinc and Vitamin C -Continue daily d-dimer, ferritin and CRP -Repeat Lasix -Transition fentanyl to Precedex to reduce apnea    COVID-19 Labs Recent Labs    04/07/19 1632 04/08/19 0420 04/09/19 0140 04/10/19 0433  DDIMER 0.55* 0.41 0.62* 0.44  FERRITIN 568* 666* 762* 565*  LDH 332* 324*  --   --   CRP 18.5* 20.1* 10.2* 4.1*      Hypernatremia Na up to 148 today due to diuresis. -Start free water -Trend Na  Hyperglycemia No previous history diabetes.  Glucoses elevated -Obtain A1c -Start Lantus -Continue SSI corrections  Asymptomatic bacteriuria Urine culture with 100K E coli.  No symptoms or fever at present. -Monitor fever curve, low threshold to treat          MDM and disposition: The below labs and imaging reports reviewed and summarized above.  Medication management as above.  This encounter involves a high degree of medical risk.  An extensive number of diagnoses or treatment options were managed, and extensive complexity of data was involved in decision-making.  35 minutes was spent with the  patient, face to face or on the floor, with greater than 50% of total time spent examining patient, counseling family by phone regarding her condition, and coordinating care.    DVT prophylaxis: Lovenox Code Status: FULL Family Communication: Daughter by phone    Consultants:   PCCM  Procedures:   ETT 6/2  Antimicrobials:   Remdesivir 6/1 >>    Culture data:   6/1 blood culture x1 -- NGTD  6/1 RVP -- Negative  6/2 Urine culture -- NGTD  MRSA nares -- Neg   Subjective: Able to wean sedation this morning, some apneic episodes with SBT.  No fever, no respiratory distress.  Objective: Vitals:   04/10/19 0743 04/10/19 0800 04/10/19 0900 04/10/19 1000  BP: 117/70 135/72 (!) 141/86 129/79  Pulse: 92 83 94 84  Resp: (!) 35 (!) 35 (!) 30 11  Temp:  99.4 F (37.4 C)    TempSrc:  Oral    SpO2: 91% 92% 93% 91%  Weight:      Height:        Intake/Output Summary (Last 24 hours) at 04/10/2019 1143 Last data filed at 04/10/2019 0900 Gross per 24 hour  Intake 1582.73 ml  Output 1465 ml  Net 117.73 ml   Filed Weights   04/07/19 2003 04/09/19 0500 04/10/19 0400  Weight: 67.3 kg 68.6 kg 64.8 kg    Examination: General appearance: Well-nourished adult female, intubated and sedated HEENT: Anicteric, conjunctiva normal, lids and lashes normal. No nasal deformity,  discharge, epistaxis. ETT in place   Skin: Warm and dry.  No jaundice.  No suspicious rashes or lesions. Cardiac: Regular rate and rhythm, no murmurs, JVP normal, no lower extremity edema. Respiratory: Debated, ET tube in place, lung sounds diminished, no rales or wheezes. Abdomen: Abdomen soft, no grimace to palpation, no ascites, distention, or hepatosplenomegaly. Neuro/Psych: Intubated, makes eye contact, follows commands.    Data Reviewed: I have personally reviewed following labs and imaging studies:  CBC: Recent Labs  Lab 04/07/19 1452 04/08/19 0420 04/08/19 1357 04/08/19 1558 04/08/19 1830  04/09/19 0140 04/10/19 0433  WBC 7.8 6.0  --   --   --  7.3 12.4*  NEUTROABS 7.1 5.6  --   --   --  6.5 11.0*  HGB 12.9 12.4 11.6* 12.6 11.6* 11.8* 11.8*  HCT 38.5 38.2 34.0* 37.0 34.0* 36.9 36.9  MCV 93.4 93.9  --   --   --  95.6 97.1  PLT 251 279  --   --   --  308 417   Basic Metabolic Panel: Recent Labs  Lab 04/07/19 1452 04/08/19 0420 04/08/19 1357 04/08/19 1558 04/08/19 1600 04/08/19 1830 04/09/19 0140 04/10/19 0433  NA 137 141 143 143  --  143 141 148*  K 3.2* 3.5 3.6 3.8  --  4.0 3.8 3.7  CL 105 110  --   --   --   --  113* 113*  CO2 24 22  --   --   --   --  24 28  GLUCOSE 141* 182*  --   --   --   --  204* 213*  BUN 6 8  --   --   --   --  20 30*  CREATININE 0.59 0.57  --   --   --   --  0.61 0.66  CALCIUM 8.6* 8.2*  --   --   --   --  8.2* 8.4*  MG  --  1.9  --   --  2.0  --  2.2  --   PHOS  --  2.8  --   --  4.3  --  2.8  --    GFR: Estimated Creatinine Clearance: 67.5 mL/min (by C-G formula based on SCr of 0.66 mg/dL). Liver Function Tests: Recent Labs  Lab 04/07/19 1452 04/08/19 0420 04/09/19 0140 04/10/19 0433  AST 50* 50* 38 34  ALT 40 41 37 37  ALKPHOS 70 70 63 69  BILITOT 0.5 0.3 0.2* 0.2*  PROT 7.4 6.6 6.0* 6.0*  ALBUMIN 3.4* 2.8* 2.5* 2.6*   No results for input(s): LIPASE, AMYLASE in the last 168 hours. No results for input(s): AMMONIA in the last 168 hours. Coagulation Profile: No results for input(s): INR, PROTIME in the last 168 hours. Cardiac Enzymes: Recent Labs  Lab 04/08/19 0420  CKTOTAL 66  TROPONINI <0.03   BNP (last 3 results) No results for input(s): PROBNP in the last 8760 hours. HbA1C: No results for input(s): HGBA1C in the last 72 hours. CBG: Recent Labs  Lab 04/09/19 1530 04/09/19 1952 04/09/19 2354 04/10/19 0414 04/10/19 0757  GLUCAP 217* 203* 224* 214* 174*   Lipid Profile: Recent Labs    04/07/19 1632 04/08/19 0420  CHOL  --  119  HDL  --  59  LDLCALC  --  45  TRIG 91 76  CHOLHDL  --  2.0    Thyroid Function Tests: No results for input(s): TSH, T4TOTAL, FREET4, T3FREE, THYROIDAB in the  last 72 hours. Anemia Panel: Recent Labs    04/09/19 0140 04/10/19 0433  FERRITIN 762* 565*   Urine analysis:    Component Value Date/Time   COLORURINE YELLOW 04/08/2019 0830   APPEARANCEUR HAZY (A) 04/08/2019 0830   LABSPEC 1.023 04/08/2019 0830   PHURINE 6.0 04/08/2019 0830   GLUCOSEU NEGATIVE 04/08/2019 0830   HGBUR NEGATIVE 04/08/2019 0830   BILIRUBINUR NEGATIVE 04/08/2019 0830   KETONESUR NEGATIVE 04/08/2019 0830   PROTEINUR 30 (A) 04/08/2019 0830   NITRITE POSITIVE (A) 04/08/2019 0830   LEUKOCYTESUR MODERATE (A) 04/08/2019 0830   Sepsis Labs: @LABRCNTIP (procalcitonin:4,lacticacidven:4)  ) Recent Results (from the past 240 hour(s))  SARS Coronavirus 2 (CEPHEID- Performed in [facility] hospital lab), Hosp Order     Status: Abnormal   Collection Time: 04/07/19  2:53 PM  Result Value Ref Range Status   SARS Coronavirus 2 POSITIVE (A) NEGATIVE Final    Comment: RESULT CALLED TO, READ BACK BY AND VERIFIED WITH: Ethelda Chick [id number] @ 1700 BY J SCOTTON (NOTE) If result is NEGATIVE SARS-CoV-2 target nucleic acids are NOT DETECTED. The SARS-CoV-2 RNA is generally detectable in upper and lower  respiratory specimens during the acute phase of infection. The lowest  concentration of SARS-CoV-2 viral copies this assay can detect is 250  copies / mL. A negative result does not preclude SARS-CoV-2 infection  and should not be used as the sole basis for treatment or other  patient management decisions.  A negative result may occur with  improper specimen collection / handling, submission of specimen other  than nasopharyngeal swab, presence of viral mutation(s) within the  areas targeted by this assay, and inadequate number of viral copies  (<250 copies / mL). A negative result must be combined with clinical  observations, patient history, and epidemiological information. If  result is POSITIVE SARS-CoV-2 target nucleic acids are DETEC TED. The SARS-CoV-2 RNA is generally detectable in upper and lower  respiratory specimens during the acute phase of infection.  Positive  results are indicative of active infection with SARS-CoV-2.  Clinical  correlation with patient history and other diagnostic information is  necessary to determine patient infection status.  Positive results do  not rule out bacterial infection or co-infection with other viruses. If result is PRESUMPTIVE POSTIVE SARS-CoV-2 nucleic acids MAY BE PRESENT.   A presumptive positive result was obtained on the submitted specimen  and confirmed on repeat testing.  While 2019 novel coronavirus  (SARS-CoV-2) nucleic acids may be present in the submitted sample  additional confirmatory testing may be necessary for epidemiological  and / or clinical management purposes  to differentiate between  SARS-CoV-2 and other Sarbecovirus currently known to infect humans.  If clinically indicated additional testing with an alternate test  methodology (LAB7 453) is advised. The SARS-CoV-2 RNA is generally  detectable in upper and lower respiratory specimens during the acute  phase of infection. The expected result is Negative. Fact Sheet for Patients:  StrictlyIdeas.no Fact Sheet for Healthcare Providers: BankingDealers.co.za This test is not yet approved or cleared by the [place] FDA and has been authorized for detection and/or diagnosis of SARS-CoV-2 by FDA under an Emergency Use Authorization (EUA).  This EUA will remain in effect (meaning this test can be used) for the duration of the COVID-19 declaration under Section 564(b)(1) of the Act, 21 U.S.C. section 360bbb-3(b)(1), unless the authorization is terminated or revoked sooner. Performed at [facility], [place] [address], [place], [place] [address]   Blood culture (routine  x 2)      Status: None (Preliminary result)   Collection Time: 04/07/19  4:21 PM  Result Value Ref Range Status   Specimen Description   Final    BLOOD RIGHT HAND Performed at [facility] [address], [place], [place] [address]    Special Requests   Final    BOTTLES DRAWN AEROBIC ONLY Blood Culture adequate volume Performed at [facility] [address] [place], [place] [address]    Culture   Final    NO GROWTH 3 DAYS Performed at [facility], [place] [address] [place], [place] [address]    Report Status PENDING  Incomplete  Blood culture (routine x 2)     Status: None (Preliminary result)   Collection Time: 04/07/19  4:26 PM  Result Value Ref Range Status   Specimen Description   Final    BLOOD RIGHT HAND Performed at [facility] [address] [place], [place] [address]    Special Requests   Final    BOTTLES DRAWN AEROBIC ONLY Blood Culture adequate volume Performed at [facility] [address], [place], [place] [address]    Culture   Final    NO GROWTH 3 DAYS Performed at [facility], [place] [address], [place], [place] [address]    Report Status PENDING  Incomplete  MRSA PCR Screening     Status: None   Collection Time: 04/07/19 11:58 PM  Result Value Ref Range Status   MRSA by PCR NEGATIVE NEGATIVE Final    Comment:        The GeneXpert MRSA Assay (FDA approved for NASAL specimens only), is one component of a comprehensive MRSA colonization surveillance program. It is not intended to diagnose MRSA infection nor to guide or monitor treatment for MRSA infections. Performed at [facility], [place] [address] [place], [place] [address]   Urine culture     Status: Abnormal   Collection Time: 04/08/19  8:30 AM  Result Value Ref Range Status   Specimen Description   Final    URINE, RANDOM Performed at [facility] [address],  [place], [place] [address]    Special Requests   Final    NONE Performed at [facility], [place] [address] [place], [place] [address]    Culture >=100,000 COLONIES/mL ESCHERICHIA COLI (A)  Final   Report Status 04/10/2019 FINAL  Final   Organism ID, Bacteria ESCHERICHIA COLI (A)  Final      Susceptibility   Escherichia coli - MIC*    AMPICILLIN 4 SENSITIVE Sensitive     CEFAZOLIN <=4 SENSITIVE Sensitive     CEFTRIAXONE <=1 SENSITIVE Sensitive     CIPROFLOXACIN <=0.25 SENSITIVE Sensitive     GENTAMICIN <=1 SENSITIVE Sensitive     IMIPENEM <=0.25 SENSITIVE Sensitive     NITROFURANTOIN <=16 SENSITIVE Sensitive     TRIMETH/SULFA <=20 SENSITIVE Sensitive     AMPICILLIN/SULBACTAM <=2 SENSITIVE Sensitive     PIP/TAZO <=4 SENSITIVE Sensitive     Extended ESBL NEGATIVE Sensitive     * >=100,000 COLONIES/mL ESCHERICHIA COLI  Respiratory Panel by PCR     Status: None   Collection Time: 04/08/19  8:45 AM  Result Value Ref Range Status   Adenovirus NOT DETECTED NOT DETECTED Final   Coronavirus 229E NOT DETECTED NOT DETECTED Final    Comment: (NOTE) The Coronavirus on the Respiratory Panel, DOES NOT test for  the novel  Coronavirus (2019 nCoV)    Coronavirus HKU1 NOT DETECTED NOT DETECTED Final   Coronavirus NL63 NOT DETECTED NOT DETECTED Final   Coronavirus OC43 NOT DETECTED NOT DETECTED Final   Metapneumovirus NOT DETECTED NOT DETECTED Final   Rhinovirus / Enterovirus NOT DETECTED NOT DETECTED Final   Influenza A NOT DETECTED NOT DETECTED Final   Influenza B NOT DETECTED NOT DETECTED Final   Parainfluenza Virus 1 NOT DETECTED NOT DETECTED Final   Parainfluenza Virus 2 NOT DETECTED NOT DETECTED Final   Parainfluenza Virus 3 NOT DETECTED NOT DETECTED Final   Parainfluenza Virus 4 NOT DETECTED NOT DETECTED Final   Respiratory Syncytial Virus NOT DETECTED NOT DETECTED Final   Bordetella pertussis NOT DETECTED NOT DETECTED Final   Chlamydophila pneumoniae NOT DETECTED NOT  DETECTED Final   Mycoplasma pneumoniae NOT DETECTED NOT DETECTED Final    Comment: Performed at [facility], [place] [address] [place], [place] [address]         Radiology Studies: Dg Abd 1 View  Result Date: 04/08/2019 CLINICAL DATA:  OG tube placement. EXAM: ABDOMEN - 1 VIEW COMPARISON:  None. FINDINGS: OG tube is in place with the tip in the distal stomach or first portion of the duodenum. IMPRESSION: As above. Electronically Signed   By: Inge Rise M.D.   On: 04/08/2019 15:05   Dg Chest Port 1 View  Result Date: 04/08/2019 CLINICAL DATA:  Multifocal pneumonia.  COVID Positive. EXAM: PORTABLE CHEST 1 VIEW COMPARISON:  04/07/2019 FINDINGS: Endotracheal tube tip is 11 mm above the carina. NG tube tip is in the distal stomach or duodenal bulb. The patient has extensive bilateral hazy pulmonary infiltrates which appear less prominent than on the prior study but the aeration is improved in both lungs as compared to the prior exam. Heart size and vascularity are normal. No effusions. Bones are normal. IMPRESSION: 1. Extensive bilateral pulmonary infiltrates consistent with the patient's history of COVID-19. 2. Endotracheal tube is 11 mm above the carina and could be slightly retracted. Electronically Signed   By: Lorriane Shire M.D.   On: 04/08/2019 15:06        Scheduled Meds: . chlorhexidine gluconate (MEDLINE KIT)  15 mL Mouth Rinse BID  . Chlorhexidine Gluconate Cloth  6 each Topical Q0600  . enoxaparin (LOVENOX) injection  40 mg Subcutaneous Q12H  . feeding supplement (OSMOLITE 1.2 CAL)  1,000 mL Per Tube Q24H  . feeding supplement (PRO-STAT SUGAR FREE 64)  30 mL Per Tube TID  . furosemide  40 mg Intravenous Once  . insulin aspart  0-15 Units Subcutaneous Q4H  . insulin glargine  12 Units Subcutaneous Daily  . mouth rinse  15 mL Mouth Rinse 10 times per day  . methylPREDNISolone (SOLU-MEDROL) injection  40 mg Intravenous Q8H  . pantoprazole sodium  40 mg Per Tube Q1200   . vitamin C  500 mg Oral Daily  . zinc sulfate  220 mg Oral Daily   Continuous Infusions: . sodium chloride 10 mL/hr at 04/10/19 0900  . dexmedetomidine (PRECEDEX) IV infusion 0.8 mcg/kg/hr (04/10/19 1009)  . fentaNYL infusion INTRAVENOUS 200 mcg/hr (04/10/19 0900)  . remdesivir 100 mg in NS 250 mL Stopped (04/09/19 2232)     LOS: 3 days    Time spent:  During this encounter: Patient Isolation: Airborne, contact, droplet HCP PPE: Face shield, head covering, N95, gown, gloves, and shoe covers    Edwin Dada, MD [facility] 04/10/2019, 11:43 AM  Please page through AMION:  [email or web address] Password [email or web address] If 7PM-7AM, please contact night-coverage

## 2019-04-10 NOTE — Progress Notes (Signed)
Around 1430 after extubation, pt took O2 off and states "she wants to die".... she repeated this over and over and x2 she took her HFNC off and O2 dipped in the low 80s.  Not sure if pt is delirious .Marland Kitchen.. pt is A&O x3 and disoriented to time.  Notified MD... new orders placed.... ativan for anxiety and haldol for delirium.   Called and updated family members via facetime through pt's phone. (Daughter = Jocelyn and Son)  Son sts pt was saying she wanted to die before coming to the hospital and it was no surprise to him.   Both son and daughter spoke w/mother and encouraged her.... Pt sts she will comply and will continue to "live"  Will continue to monitor pt closely.... pt is resting comfortably.... Bedside report given to Ginger RN.

## 2019-04-11 DIAGNOSIS — R440 Auditory hallucinations: Secondary | ICD-10-CM

## 2019-04-11 DIAGNOSIS — R4182 Altered mental status, unspecified: Secondary | ICD-10-CM

## 2019-04-11 LAB — GLUCOSE, CAPILLARY
Glucose-Capillary: 128 mg/dL — ABNORMAL HIGH (ref 70–99)
Glucose-Capillary: 143 mg/dL — ABNORMAL HIGH (ref 70–99)
Glucose-Capillary: 158 mg/dL — ABNORMAL HIGH (ref 70–99)
Glucose-Capillary: 158 mg/dL — ABNORMAL HIGH (ref 70–99)
Glucose-Capillary: 170 mg/dL — ABNORMAL HIGH (ref 70–99)
Glucose-Capillary: 195 mg/dL — ABNORMAL HIGH (ref 70–99)

## 2019-04-11 MED ORDER — HALOPERIDOL LACTATE 5 MG/ML IJ SOLN
5.0000 mg | Freq: Once | INTRAMUSCULAR | Status: AC
  Administered 2019-04-11: 5 mg via INTRAMUSCULAR

## 2019-04-11 MED ORDER — DEXMEDETOMIDINE HCL IN NACL 400 MCG/100ML IV SOLN
0.0000 ug/kg/h | INTRAVENOUS | Status: DC
  Administered 2019-04-11 – 2019-04-12 (×2): 0.4 ug/kg/h via INTRAVENOUS
  Filled 2019-04-11 (×2): qty 100

## 2019-04-11 MED ORDER — ALBUTEROL SULFATE HFA 108 (90 BASE) MCG/ACT IN AERS
1.0000 | INHALATION_SPRAY | RESPIRATORY_TRACT | Status: DC | PRN
  Administered 2019-04-14 (×4): 1 via RESPIRATORY_TRACT
  Filled 2019-04-11: qty 6.7

## 2019-04-11 MED ORDER — ENSURE ENLIVE PO LIQD
237.0000 mL | Freq: Three times a day (TID) | ORAL | Status: DC
  Administered 2019-04-12 – 2019-04-19 (×11): 237 mL via ORAL
  Filled 2019-04-11: qty 474
  Filled 2019-04-11 (×8): qty 237

## 2019-04-11 MED ORDER — FUROSEMIDE 10 MG/ML IJ SOLN
40.0000 mg | Freq: Once | INTRAMUSCULAR | Status: AC
  Administered 2019-04-11: 12:00:00 40 mg via INTRAVENOUS
  Filled 2019-04-11: qty 4

## 2019-04-11 MED ORDER — PANTOPRAZOLE SODIUM 40 MG PO TBEC: 40.0000 mg | DELAYED_RELEASE_TABLET | Freq: Every day | ORAL | Status: DC

## 2019-04-11 NOTE — Plan of Care (Signed)

## 2019-04-11 NOTE — Progress Notes (Signed)
PROGRESS NOTE    Joyce Mcguire  HYI:[id number] DOB: 05-28-1966 DOA: 04/07/2019 PCP: System, Pcp Not In      Brief Narrative:  Joyce Mcguire is a 53 y.o. F with no significant PMHx who presented with 5 days cough, progressive SOB.  In the ER, SpO2 < 90%, required 4L [place].  CXR showed bilateral opacities, SARS-CoV-2 NAA positive.    Transferred to [facility], intubated on hospital day 2 due to tachypnea, fatigue, escalating O2 requirements.  Remdesivir started, Actemra and convalescent plasma given.  Extubated 6/4.   Assessment & Plan:  Coronavirus pneumonitis with acute hypoxic respiratory failure In setting of ongoing 2020 COVID-19 pandemic.  S/p Actemra 6/1.  S/p convalescent plasma 6/2.   Refused labs today. -Continue Remdesivir day 5 of 5 -Stop Solumedrol today -Continue VTE PPx with Lovenox, high dose -Continue Zinc and Vitamin C as able to take PO  -Cautious Lasix today   COVID-19 Labs Recent Labs    04/09/19 0140 04/10/19 0433  DDIMER 0.62* 0.44  FERRITIN 762* 565*  CRP 10.2* 4.1*    Acute delirium Depression with ?suicidal ideation Family relate that patient endorsed suicidal ideation prior to admission.  Nursing report passive suicidal ideation here. -Consult Psychiatry -Haldol 2 mg IV q6hrs PRN  Hypernatremia Na continues to rise.  Currently refusing oral intake. -Trend Na  Hyperglycemia No previous history diabetes.  Glucoses elevated -Obtain A1c -Continue Lantus and SSI  Asymptomatic bacteriuria Urine culture with 100K E coli.  No symptoms or fever at present. -Monitor fever curve, low threshold to treat          MDM and disposition: The below labs and imaging reports reviewed and summarized above.  Medication management as above.  This encounter involves a high degree of medical risk.  An extensive number of diagnoses or treatment options were managed, and extensive complexity of data was involved in decision-making.  35 minutes was spent  with the patient, face to face or on the floor, with greater than 50% of total time spent examining patient, counseling family by phone regarding her condition, and coordinating care.    DVT prophylaxis: Lovenox Code Status: FULL Family Communication: Daughter by phone    Consultants:   PCCM  Procedures:   ETT 6/2  Antimicrobials:   Remdesivir 6/1 >>    Culture data:   6/1 blood culture x1 -- NGTD  6/1 RVP -- Negative  6/2 Urine culture -- NGTD  MRSA nares -- Neg   Subjective: Extubated yesterday, but overnight delirious, pulling off her nasal cannula, expressing passive suicidal ideation, pulling at lines.      Objective: Vitals:   04/11/19 1000 04/11/19 1100 04/11/19 1200 04/11/19 1300  BP: (!) 137/95 (!) 139/101 126/80 104/62  Pulse: 78 92 70 (!) 57  Resp: 17 11 17 18   Temp:   99.2 F (37.3 C)   TempSrc:   Axillary   SpO2: 91% (!) 86% 92% 92%  Weight:      Height:        Intake/Output Summary (Last 24 hours) at 04/11/2019 1405 Last data filed at 04/11/2019 1300 Gross per 24 hour  Intake 459.37 ml  Output 1405 ml  Net -945.63 ml   Filed Weights   04/07/19 2003 04/09/19 0500 04/10/19 0400  Weight: 67.3 kg 68.6 kg 64.8 kg    Examination: General appearance: Well-nourished adult female, eyes closed, does not respond to questions.  Mumbling and inattentive. HEENT: Anicteric, conjunctiva normal, lids and lashes normal. No nasal deformity,  discharge, epistaxis.  Oropharynx tacky dry, no oral lesions. Skin: Warm and dry.  No jaundice.  No suspicious rashes or lesions. Cardiac: Tachycardic, regular, JVP normal, no lower extremity edema.Marland Kitchen Respiratory: Respirations shallow, lung sounds diminished, no rales or wheezes appreciated. Abdomen: Abdomen soft, no tenderness to palpation, no ascites or distention. Neuro/Psych: Does not follow commands, repeats over and over "quiero morir".  Fights all nursing cares.    Data Reviewed: I have personally reviewed  following labs and imaging studies:  CBC: Recent Labs  Lab 04/07/19 1452 04/08/19 0420 04/08/19 1357 04/08/19 1558 04/08/19 1830 04/09/19 0140 04/10/19 0433  WBC 7.8 6.0  --   --   --  7.3 12.4*  NEUTROABS 7.1 5.6  --   --   --  6.5 11.0*  HGB 12.9 12.4 11.6* 12.6 11.6* 11.8* 11.8*  HCT 38.5 38.2 34.0* 37.0 34.0* 36.9 36.9  MCV 93.4 93.9  --   --   --  95.6 97.1  PLT 251 279  --   --   --  308 812   Basic Metabolic Panel: Recent Labs  Lab 04/07/19 1452 04/08/19 0420 04/08/19 1357 04/08/19 1558 04/08/19 1600 04/08/19 1830 04/09/19 0140 04/10/19 0433  NA 137 141 143 143  --  143 141 148*  K 3.2* 3.5 3.6 3.8  --  4.0 3.8 3.7  CL 105 110  --   --   --   --  113* 113*  CO2 24 22  --   --   --   --  24 28  GLUCOSE 141* 182*  --   --   --   --  204* 213*  BUN 6 8  --   --   --   --  20 30*  CREATININE 0.59 0.57  --   --   --   --  0.61 0.66  CALCIUM 8.6* 8.2*  --   --   --   --  8.2* 8.4*  MG  --  1.9  --   --  2.0  --  2.2  --   PHOS  --  2.8  --   --  4.3  --  2.8  --    GFR: Estimated Creatinine Clearance: 67.5 mL/min (by C-G formula based on SCr of 0.66 mg/dL). Liver Function Tests: Recent Labs  Lab 04/07/19 1452 04/08/19 0420 04/09/19 0140 04/10/19 0433  AST 50* 50* 38 34  ALT 40 41 37 37  ALKPHOS 70 70 63 69  BILITOT 0.5 0.3 0.2* 0.2*  PROT 7.4 6.6 6.0* 6.0*  ALBUMIN 3.4* 2.8* 2.5* 2.6*   No results for input(s): LIPASE, AMYLASE in the last 168 hours. No results for input(s): AMMONIA in the last 168 hours. Coagulation Profile: No results for input(s): INR, PROTIME in the last 168 hours. Cardiac Enzymes: Recent Labs  Lab 04/08/19 0420  CKTOTAL 66  TROPONINI <0.03   BNP (last 3 results) No results for input(s): PROBNP in the last 8760 hours. HbA1C: No results for input(s): HGBA1C in the last 72 hours. CBG: Recent Labs  Lab 04/10/19 1631 04/10/19 2020 04/10/19 2359 04/11/19 0812 04/11/19 1203  GLUCAP 142* 127* 143* 158* 195*   Lipid  Profile: No results for input(s): CHOL, HDL, LDLCALC, TRIG, CHOLHDL, LDLDIRECT in the last 72 hours. Thyroid Function Tests: No results for input(s): TSH, T4TOTAL, FREET4, T3FREE, THYROIDAB in the last 72 hours. Anemia Panel: Recent Labs    04/09/19 0140 04/10/19 0433  FERRITIN 762* 565*  Urine analysis:    Component Value Date/Time   COLORURINE YELLOW 04/08/2019 0830   APPEARANCEUR HAZY (A) 04/08/2019 0830   LABSPEC 1.023 04/08/2019 0830   PHURINE 6.0 04/08/2019 0830   GLUCOSEU NEGATIVE 04/08/2019 0830   HGBUR NEGATIVE 04/08/2019 0830   BILIRUBINUR NEGATIVE 04/08/2019 0830   KETONESUR NEGATIVE 04/08/2019 0830   PROTEINUR 30 (A) 04/08/2019 0830   NITRITE POSITIVE (A) 04/08/2019 0830   LEUKOCYTESUR MODERATE (A) 04/08/2019 0830   Sepsis Labs: @LABRCNTIP (procalcitonin:4,lacticacidven:4)  ) Recent Results (from the past 240 hour(s))  SARS Coronavirus 2 (CEPHEID- Performed in [facility] hospital lab), Hosp Order     Status: Abnormal   Collection Time: 04/07/19  2:53 PM  Result Value Ref Range Status   SARS Coronavirus 2 POSITIVE (A) NEGATIVE Final    Comment: RESULT CALLED TO, READ BACK BY AND VERIFIED WITH: Ethelda Chick [id number] @ [facility] (NOTE) If result is NEGATIVE SARS-CoV-2 target nucleic acids are NOT DETECTED. The SARS-CoV-2 RNA is generally detectable in upper and lower  respiratory specimens during the acute phase of infection. The lowest  concentration of SARS-CoV-2 viral copies this assay can detect is 250  copies / mL. A negative result does not preclude SARS-CoV-2 infection  and should not be used as the sole basis for treatment or other  patient management decisions.  A negative result may occur with  improper specimen collection / handling, submission of specimen other  than nasopharyngeal swab, presence of viral mutation(s) within the  areas targeted by this assay, and inadequate number of viral copies  (<250 copies / mL). A negative result  must be combined with clinical  observations, patient history, and epidemiological information. If result is POSITIVE SARS-CoV-2 target nucleic acids are DETEC TED. The SARS-CoV-2 RNA is generally detectable in upper and lower  respiratory specimens during the acute phase of infection.  Positive  results are indicative of active infection with SARS-CoV-2.  Clinical  correlation with patient history and other diagnostic information is  necessary to determine patient infection status.  Positive results do  not rule out bacterial infection or co-infection with other viruses. If result is PRESUMPTIVE POSTIVE SARS-CoV-2 nucleic acids MAY BE PRESENT.   A presumptive positive result was obtained on the submitted specimen  and confirmed on repeat testing.  While 2019 novel coronavirus  (SARS-CoV-2) nucleic acids may be present in the submitted sample  additional confirmatory testing may be necessary for epidemiological  and / or clinical management purposes  to differentiate between  SARS-CoV-2 and other Sarbecovirus currently known to infect humans.  If clinically indicated additional testing with an alternate test  methodology (LAB7 453) is advised. The SARS-CoV-2 RNA is generally  detectable in upper and lower respiratory specimens during the acute  phase of infection. The expected result is Negative. Fact Sheet for Patients:  StrictlyIdeas.no Fact Sheet for Healthcare Providers: BankingDealers.co.za This test is not yet approved or cleared by the [place] FDA and has been authorized for detection and/or diagnosis of SARS-CoV-2 by FDA under an Emergency Use Authorization (EUA).  This EUA will remain in effect (meaning this test can be used) for the duration of the COVID-19 declaration under Section 564(b)(1) of the Act, 21 U.S.C. section 360bbb-3(b)(1), unless the authorization is terminated or revoked sooner. Performed at [facility], [place] [address], [place], [place] [address]   Blood culture (routine x 2)     Status: None (Preliminary result)   Collection Time: 04/07/19  4:21 PM  Result Value Ref Range Status   Specimen Description   Final    BLOOD RIGHT HAND Performed at [facility] [address] [place], [place] [address]    Special Requests   Final    BOTTLES DRAWN AEROBIC ONLY Blood Culture adequate volume Performed at [facility] [address] [place], [place] [address]    Culture   Final    NO GROWTH 4 DAYS Performed at [facility], [place] [address], [place], [place] [address]    Report Status PENDING  Incomplete  Blood culture (routine x 2)     Status: None (Preliminary result)   Collection Time: 04/07/19  4:26 PM  Result Value Ref Range Status   Specimen Description   Final    BLOOD RIGHT HAND Performed at [facility] [address], [place], [place] [address]    Special Requests   Final    BOTTLES DRAWN AEROBIC ONLY Blood Culture adequate volume Performed at [facility] [address], [place], [place] [address]    Culture   Final    NO GROWTH 4 DAYS Performed at [facility], [place] [address], [place], [place] [address]    Report Status PENDING  Incomplete  MRSA PCR Screening     Status: None   Collection Time: 04/07/19 11:58 PM  Result Value Ref Range Status   MRSA by PCR NEGATIVE NEGATIVE Final    Comment:        The GeneXpert MRSA Assay (FDA approved for NASAL specimens only), is one component of a comprehensive MRSA colonization surveillance program. It is not intended to diagnose MRSA infection nor to guide or monitor treatment for MRSA infections. Performed at [facility], [place] [address] [place], [place] [address]   Urine culture     Status: Abnormal   Collection Time: 04/08/19  8:30 AM  Result Value Ref Range Status   Specimen Description    Final    URINE, RANDOM Performed at [facility] [address], [place],  [address]    Special Requests   Final    NONE Performed at [facility], [place] [address], [place],  [address]    Culture >=100,000 COLONIES/mL ESCHERICHIA COLI (A)  Final   Report Status 04/10/2019 FINAL  Final   Organism ID, Bacteria ESCHERICHIA COLI (A)  Final      Susceptibility   Escherichia coli - MIC*    AMPICILLIN 4 SENSITIVE Sensitive     CEFAZOLIN <=4 SENSITIVE Sensitive     CEFTRIAXONE <=1 SENSITIVE Sensitive     CIPROFLOXACIN <=0.25 SENSITIVE Sensitive     GENTAMICIN <=1 SENSITIVE Sensitive     IMIPENEM <=0.25 SENSITIVE Sensitive     NITROFURANTOIN <=16 SENSITIVE Sensitive     TRIMETH/SULFA <=20 SENSITIVE Sensitive     AMPICILLIN/SULBACTAM <=2 SENSITIVE Sensitive     PIP/TAZO <=4 SENSITIVE Sensitive     Extended ESBL NEGATIVE Sensitive     * >=100,000 COLONIES/mL ESCHERICHIA COLI  Respiratory Panel by PCR     Status: None   Collection Time: 04/08/19  8:45 AM  Result Value Ref Range Status   Adenovirus NOT DETECTED NOT DETECTED Final   Coronavirus 229E NOT DETECTED NOT DETECTED Final    Comment: (NOTE) The Coronavirus on the Respiratory Panel, DOES NOT test for the novel  Coronavirus (2019 nCoV)    Coronavirus HKU1 NOT DETECTED NOT DETECTED Final   Coronavirus NL63  NOT DETECTED NOT DETECTED Final   Coronavirus OC43 NOT DETECTED NOT DETECTED Final   Metapneumovirus NOT DETECTED NOT DETECTED Final   Rhinovirus / Enterovirus NOT DETECTED NOT DETECTED Final   Influenza A NOT DETECTED NOT DETECTED Final   Influenza B NOT DETECTED NOT DETECTED Final   Parainfluenza Virus 1 NOT DETECTED NOT DETECTED Final   Parainfluenza Virus 2 NOT DETECTED NOT DETECTED Final   Parainfluenza Virus 3 NOT DETECTED NOT DETECTED Final   Parainfluenza Virus 4 NOT DETECTED NOT DETECTED Final   Respiratory Syncytial Virus NOT DETECTED NOT DETECTED Final    Bordetella pertussis NOT DETECTED NOT DETECTED Final   Chlamydophila pneumoniae NOT DETECTED NOT DETECTED Final   Mycoplasma pneumoniae NOT DETECTED NOT DETECTED Final    Comment: Performed at [facility], [place] [address] [place], [place] [address]         Radiology Studies: No results found.      Scheduled Meds: . chlorhexidine gluconate (MEDLINE KIT)  15 mL Mouth Rinse BID  . Chlorhexidine Gluconate Cloth  6 each Topical Q0600  . enoxaparin (LOVENOX) injection  40 mg Subcutaneous Q12H  . feeding supplement (ENSURE ENLIVE)  237 mL Oral TID BM  . insulin aspart  0-15 Units Subcutaneous Q4H  . insulin glargine  12 Units Subcutaneous Daily  . mouth rinse  15 mL Mouth Rinse 10 times per day  . vitamin C  500 mg Oral Daily  . zinc sulfate  220 mg Oral Daily   Continuous Infusions: . sodium chloride Stopped (04/11/19 0926)  . dexmedetomidine (PRECEDEX) IV infusion 0.4 mcg/kg/hr (04/11/19 1300)  . remdesivir 100 mg in NS 250 mL Stopped (04/10/19 2200)     LOS: 4 days    Time spent: 35 minutes  During this encounter: Patient Isolation: Airborne, contact, droplet HCP PPE: Face shield, head covering, N95, gown, gloves, and shoe covers    Edwin Dada, MD [facility] 04/11/2019, 2:05 PM     Please page through [facility]:  [email or web address] Password [email or web address] If 7PM-7AM, please contact night-coverage

## 2019-04-11 NOTE — Consult Note (Addendum)
Telepsych Consultation   Reason for Consult:  SI Referring Physician:  Dr. Myrene Buddy Location of Patient:  CGV-ICU Location of Provider: [facility]  Patient Identification: Joyce Mcguire MRN:  [id number] Principal Diagnosis: Altered mental status Diagnosis:  Active Problems:   COVID-19 virus infection   Asthma   Total Time spent with patient: 1 hour  Subjective:   Joyce Mcguire is a 53 y.o. female patient admitted with severe acute respiratory failure with hypoxemia due to Covid-19 pneumonia.  HPI:   Per chart review, patient was admitted with severe acute respiratory failure with hypoxemia due to Covid-19 pneumonia requiring intubation. She is currently on HFNC. She has been refusing treatment and asking the team to let her die. She has been tearful. Her son and daughter report that she endorsed suicidal thoughts prior to admission. She received Haldol 2 mg this morning for agitation.   Interpreter services were used to communicate with patient since her primary language is [place]. Joyce Mcguire "Joyce Mcguire" is unwilling to participate in interview. She repeats, "no no no" and gestures for her nurse to leave. Her nurse reports that she is mostly still and keeps her eyes open while looking at the ceiling. She has been disoriented and unable to appropriately answer questions.   Patient's daughter-in-law, Gerald Stabs was contacted by phone. She reports that she is normally happy and does not have a prior psychiatric history. She reports that she has never made statements about wanting to harm herself in the past. She started making statements about SI after she became sick and making statements like "it's about to be over" and "I'm tired of not being able to breath." She has been disoriented and today she told her family that she is hearing voices.    Past Psychiatric History: None. Family denies a history of suicide attempts.   Risk to Self:   Low. Patient has been endorsing  passive SI in the setting of AMS.   Risk to Others:  None. No known history of violence.  Prior Inpatient Therapy:  Denies  Prior Outpatient Therapy:  Denies  Past Medical History: History reviewed. No pertinent past medical history. History reviewed. No pertinent surgical history. Family History: History reviewed. No pertinent family history. Family Psychiatric  History: Denies  Social History:  Social History   Substance and Sexual Activity  Alcohol Use Not on file     Social History   Substance and Sexual Activity  Drug Use Not on file    Social History   Socioeconomic History  . Marital status: Married    Spouse name: Not on file  . Number of children: Not on file  . Years of education: Not on file  . Highest education level: Not on file  Occupational History  . Not on file  Social Needs  . Financial resource strain: Not on file  . Food insecurity:    Worry: Not on file    Inability: Not on file  . Transportation needs:    Medical: Not on file    Non-medical: Not on file  Tobacco Use  . Smoking status: Not on file  Substance and Sexual Activity  . Alcohol use: Not on file  . Drug use: Not on file  . Sexual activity: Not on file  Lifestyle  . Physical activity:    Days per week: Not on file    Minutes per session: Not on file  . Stress: Not on file  Relationships  . Social connections:    Talks  on phone: Not on file    Gets together: Not on file    Attends religious service: Not on file    Active member of club or organization: Not on file    Attends meetings of clubs or organizations: Not on file    Relationship status: Not on file  Other Topics Concern  . Not on file  Social History Narrative  . Not on file   Additional Social History: She lives with her husband and 2 daughters. She works at a [employer]. She drinks socially and does not use illicit substances.     Allergies:   Allergies  Allergen Reactions  . Lactose Intolerance  (Gi) Diarrhea and Nausea And Vomiting    Labs:  Results for orders placed or performed during the hospital encounter of 04/07/19 (from the past 48 hour(s))  Glucose, capillary     Status: Abnormal   Collection Time: 04/09/19 11:44 AM  Result Value Ref Range   Glucose-Capillary 203 (H) 70 - 99 mg/dL  Glucose, capillary     Status: Abnormal   Collection Time: 04/09/19  3:30 PM  Result Value Ref Range   Glucose-Capillary 217 (H) 70 - 99 mg/dL  Glucose, capillary     Status: Abnormal   Collection Time: 04/09/19  7:52 PM  Result Value Ref Range   Glucose-Capillary 203 (H) 70 - 99 mg/dL  Glucose, capillary     Status: Abnormal   Collection Time: 04/09/19 11:54 PM  Result Value Ref Range   Glucose-Capillary 224 (H) 70 - 99 mg/dL  Glucose, capillary     Status: Abnormal   Collection Time: 04/10/19  4:14 AM  Result Value Ref Range   Glucose-Capillary 214 (H) 70 - 99 mg/dL  Ferritin     Status: Abnormal   Collection Time: 04/10/19  4:33 AM  Result Value Ref Range   Ferritin 565 (H) 11 - 307 ng/mL    Comment: Performed at [facility], [place] [address], [place], [place] [address]  D-dimer, quantitative (not at [facility])     Status: None   Collection Time: 04/10/19  4:33 AM  Result Value Ref Range   D-Dimer, Quant 0.44 0.00 - 0.50 ug/mL-FEU    Comment: (NOTE) At the manufacturer cut-off of 0.50 ug/mL FEU, this assay has been documented to exclude PE with a sensitivity and negative predictive value of 97 to 99%.  At this time, this assay has not been approved by the FDA to exclude DVT/VTE. Results should be correlated with clinical presentation. Performed at [facility], [place] [address] [place], [place] [address]   Comprehensive metabolic panel     Status: Abnormal   Collection Time: 04/10/19  4:33 AM  Result Value Ref Range   Sodium 148 (H) 135 - 145 mmol/L   Potassium 3.7 3.5 - 5.1 mmol/L   Chloride 113 (H) 98 - 111 mmol/L   CO2 28 22 - 32 mmol/L    Glucose, Bld 213 (H) 70 - 99 mg/dL   BUN 30 (H) 6 - 20 mg/dL   Creatinine, Ser 0.66 0.44 - 1.00 mg/dL   Calcium 8.4 (L) 8.9 - 10.3 mg/dL   Total Protein 6.0 (L) 6.5 - 8.1 g/dL   Albumin 2.6 (L) 3.5 - 5.0 g/dL   AST 34 15 - 41 U/L   ALT 37 0 - 44 U/L   Alkaline Phosphatase 69 38 - 126 U/L   Total Bilirubin 0.2 (L) 0.3 - 1.2 mg/dL   GFR  calc non Af Amer >60 >60 mL/min   GFR calc Af Amer >60 >60 mL/min   Anion gap 7 5 - 15    Comment: Performed at [facility], [place] [address] [place], [place] [address]  CBC with Differential/Platelet     Status: Abnormal   Collection Time: 04/10/19  4:33 AM  Result Value Ref Range   WBC 12.4 (H) 4.0 - 10.5 K/uL   RBC 3.80 (L) 3.87 - 5.11 MIL/uL   Hemoglobin 11.8 (L) 12.0 - 15.0 g/dL   HCT 36.9 36.0 - 46.0 %   MCV 97.1 80.0 - 100.0 fL   MCH 31.1 26.0 - 34.0 pg   MCHC 32.0 30.0 - 36.0 g/dL   RDW 13.2 11.5 - 15.5 %   Platelets 346 150 - 400 K/uL   nRBC 0.0 0.0 - 0.2 %   Neutrophils Relative % 88 %   Neutro Abs 11.0 (H) 1.7 - 7.7 K/uL   Lymphocytes Relative 5 %   Lymphs Abs 0.6 (L) 0.7 - 4.0 K/uL   Monocytes Relative 5 %   Monocytes Absolute 0.6 0.1 - 1.0 K/uL   Eosinophils Relative 0 %   Eosinophils Absolute 0.0 0.0 - 0.5 K/uL   Basophils Relative 0 %   Basophils Absolute 0.0 0.0 - 0.1 K/uL   Immature Granulocytes 2 %   Abs Immature Granulocytes 0.22 (H) 0.00 - 0.07 K/uL    Comment: Performed at [facility], [place] [address], [place], [place] [address]  C-reactive protein     Status: Abnormal   Collection Time: 04/10/19  4:33 AM  Result Value Ref Range   CRP 4.1 (H) <1.0 mg/dL    Comment: Performed at [facility], [place] [address], [place],  [address]  Glucose, capillary     Status: Abnormal   Collection Time: 04/10/19  7:57 AM  Result Value Ref Range   Glucose-Capillary 174 (H) 70 - 99 mg/dL  Glucose, capillary     Status: Abnormal   Collection Time: 04/10/19  1:21 PM   Result Value Ref Range   Glucose-Capillary 223 (H) 70 - 99 mg/dL  Glucose, capillary     Status: Abnormal   Collection Time: 04/10/19  4:31 PM  Result Value Ref Range   Glucose-Capillary 142 (H) 70 - 99 mg/dL  Glucose, capillary     Status: Abnormal   Collection Time: 04/10/19  8:20 PM  Result Value Ref Range   Glucose-Capillary 127 (H) 70 - 99 mg/dL  Glucose, capillary     Status: Abnormal   Collection Time: 04/10/19 11:59 PM  Result Value Ref Range   Glucose-Capillary 143 (H) 70 - 99 mg/dL  Glucose, capillary     Status: Abnormal   Collection Time: 04/11/19  8:12 AM  Result Value Ref Range   Glucose-Capillary 158 (H) 70 - 99 mg/dL    Medications:  Current Facility-Administered Medications  Medication Dose Route Frequency Provider Last Rate Last Dose  . 0.9 %  sodium chloride infusion   Intravenous PRN Juanito Doom, MD 10 mL/hr at 04/11/19 0600    . acetaminophen (TYLENOL) tablet 650 mg  650 mg Oral Q6H PRN Mariel Aloe, MD      . albuterol (VENTOLIN HFA) 108 (90 Base) MCG/ACT inhaler 1 puff  1 puff Inhalation Q4H PRN Danford, Suann Larry, MD      . ALPRAZolam Duanne Moron) tablet 1 mg  1 mg Oral Q8H PRN Deatra James, MD      .  chlorhexidine gluconate (MEDLINE KIT) (PERIDEX) 0.12 % solution 15 mL  15 mL Mouth Rinse BID Patrecia Pour, MD   15 mL at 04/10/19 0803  . Chlorhexidine Gluconate Cloth 2 % PADS 6 each  6 each Topical Q0600 Patrecia Pour, MD   6 each at 04/10/19 0617  . dexmedetomidine (PRECEDEX) 400 MCG/100ML (4 mcg/mL) infusion  0-0.8 mcg/kg/hr Intravenous Titrated Seawell, Jaimie A, DO      . enoxaparin (LOVENOX) injection 40 mg  40 mg Subcutaneous Q12H Patrecia Pour, MD   40 mg at 04/10/19 1711  . feeding supplement (OSMOLITE 1.2 CAL) liquid 1,000 mL  1,000 mL Per Tube Q24H Juanito Doom, MD   Stopped at 04/10/19 1930  . furosemide (LASIX) injection 40 mg  40 mg Intravenous Once Danford, Suann Larry, MD      . guaiFENesin-dextromethorphan (ROBITUSSIN  DM) 100-10 MG/5ML syrup 10 mL  10 mL Oral Q4H PRN Allie Bossier, MD   10 mL at 04/07/19 2323  . haloperidol lactate (HALDOL) injection 2 mg  2 mg Intravenous Q6H PRN Edwin Dada, MD   2 mg at 04/11/19 0410  . HYDROcodone-acetaminophen (NORCO/VICODIN) 5-325 MG per tablet 1-2 tablet  1-2 tablet Oral Q4H PRN Mariel Aloe, MD      . insulin aspart (novoLOG) injection 0-15 Units  0-15 Units Subcutaneous Q4H Mannam, Praveen, MD   3 Units at 04/11/19 0829  . insulin glargine (LANTUS) injection 12 Units  12 Units Subcutaneous Daily Edwin Dada, MD   12 Units at 04/11/19 [phone]  . MEDLINE mouth rinse  15 mL Mouth Rinse 10 times per day Patrecia Pour, MD   15 mL at 04/10/19 1635  . ondansetron (ZOFRAN) tablet 4 mg  4 mg Oral Q6H PRN Mariel Aloe, MD       Or  . ondansetron (ZOFRAN) injection 4 mg  4 mg Intravenous Q6H PRN Mariel Aloe, MD      . polyethylene glycol (MIRALAX / GLYCOLAX) packet 17 g  17 g Oral Daily PRN Mariel Aloe, MD      . remdesivir 100 mg in sodium chloride 0.9 % 230 mL IVPB  100 mg Intravenous Q24H Patrecia Pour, MD   Stopped at 04/10/19 2200  . vitamin C (ASCORBIC ACID) tablet 500 mg  500 mg Oral Daily Allie Bossier, MD   500 mg at 04/10/19 1011  . zinc sulfate capsule 220 mg  220 mg Oral Daily Allie Bossier, MD   220 mg at 04/10/19 1011    Musculoskeletal: Strength & Muscle Tone: No atrophy noted. Gait & Station: UTA since patient is lying in bed. Patient leans: N/A  Psychiatric Specialty Exam: Physical Exam  Nursing note and vitals reviewed. Constitutional: She appears well-developed and well-nourished.  HENT:  Head: Normocephalic and atraumatic.  Neck: Normal range of motion.  Respiratory: Effort normal.  Musculoskeletal: Normal range of motion.  Neurological: She is alert.  Only oriented to self.  Psychiatric: Her speech is normal and behavior is normal. Judgment normal. Her mood appears anxious. Cognition and memory are impaired.   Patient unwilling to participate in interview.     Review of Systems  Unable to perform ROS: Mental status change    Blood pressure (!) 144/83, pulse 80, temperature 99 F (37.2 C), temperature source Axillary, resp. rate 15, height 5' (1.524 m), weight 64.8 kg, SpO2 93 %.Body mass index is 27.91 kg/m.  General Appearance: Fairly Groomed, middle aged, Hispanic  female, wearing a hospital gown who is lying in bed. NAD.   Eye Contact:  Fair  Speech:  Clear and Coherent and Normal Rate  Volume:  Decreased  Mood:  Dysphoric  Affect:  Congruent  Thought Process:  Linear and Descriptions of Associations: Intact  Orientation:  Other:  Oriented to self.  Thought Content:  Logical  Suicidal Thoughts:  Yes.  without intent/plan per family and primary team.   Homicidal Thoughts:  No  Memory:  Immediate;   UTA since patient does not want to participate in interview.  Recent;   UTA since patient does not want to participate in interview.  Remote;   UTA since patient does not want to participate in interview.   Judgement:  Impaired  Insight:  Lacking  Psychomotor Activity:  Decreased  Concentration:  Concentration: UTA since patient does not want to participate in interview.  and Attention Span: UTA since patient does not want to participate in interview.   Recall:  UTA since patient does not want to participate in interview.   Fund of Knowledge:  UTA since patient does not want to participate in interview.   Language:  Fair  Akathisia:  No  Handed:  Right  AIMS (if indicated):   N/A  Assets:  Financial Resources/Insurance Housing Intimacy Resilience Social Support  ADL's:  Impaired  Cognition:  Impaired due to AMS.   Sleep:   N/A   Assessment:  Shanise Balch is a 53 y.o. female who was admitted with severe acute respiratory failure with hypoxemia due to Covid-19 pneumonia. She is unwilling to participate in interview but is reportedly disoriented per primary team. She has endorsed SI  and AH in the setting of altered mental status. She does not have a prior psychiatric history per collateral from family. Recommend Zyprexa for psychosis secondary likely secondary to delirium.   Treatment Plan Summary:  -Start Zyprexa 2.5 mg qhs for psychosis. -Please consult psychiatry if concerns for safety once mental status improves.  -Psychiatry will sign off on patient at this time. Please consult psychiatry again as needed.    Disposition: No evidence of imminent risk to self or others at present.    This service was provided via telemedicine using a 2-way, interactive audio and video technology.  Names of all persons participating in this telemedicine service and their role in this encounter. Name: Buford Dresser, DO Role: Psychiatrist  Name: [name] Role: Patient    Faythe Dingwall, DO 04/11/2019 11:43 AM

## 2019-04-11 NOTE — Progress Notes (Signed)
Spoke with patient's daughter twice and set up call with pt's priest. Pt spoke and listened to priest and prayers were said. Pt priest blessed water via phone and asked me to help perform prayer ritual. Pt sipped water after priest pleaded with her. Once off the phone, pt spit all the water onto her gown. Pt still refusing oral care, but allowed me to check her CBG. Will continue to attempt to persuade pt to allow care.

## 2019-04-11 NOTE — Progress Notes (Signed)
Called and updated pt daughter about her mother's deteriorating mental status as well as her refusal to accept treatment. Her daughter spoke to her on speaker phone for sometime and finally got the pt to agree to another call.

## 2019-04-11 NOTE — Progress Notes (Signed)
NAME:  Joyce Mcguire, MRN:  [id number], DOB:  [birth year], LOS: 4 ADMISSION DATE:  04/07/2019, CONSULTATION DATE: April 08, 2019 REFERRING MD: Dr. Jarvis NewcomerGrunz, CHIEF COMPLAINT: Dyspnea  Brief History   [age] year old female with no past medical history admitted for severe acute respiratory failure with hypoxemia due to COVID-19 pneumonia.  History of present illness   This is a [age] year old non-smoking female who says she never had any prior lung history who [facility] April 07, 2019 after being diagnosed with COVID pneumonia.  She had been diagnosed with COVID approximately 5 days prior to her arrival at [facility] room on June 1 at which point she was complaining of shortness of breath.  On my history she was quite short of breath and unable to complete sentences so she was not able to give a detailed history so history is obtained by chart review.  She was admitted by the hospitalist service, administered steroids, Tocilizumab, and Remdesivir.  However despite these interventions her oxygen needs have been steadily increasing and she has become more fatigued, breathing faster, and complaining of more shortness of breath throughout the course of the day.  She was brought to the intensive care unit for closer monitoring.  She was initially started on heated high flow oxygen which was able to improve her oxygen levels but the patient continued to complain of worsening shortness of breath and accessory muscle use.  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  Past Medical History  "asthma" > patient says that she was never diagnosed with this prior to being diagnosed with COVID  Significant Hospital Events   6/1 admission 6/2 intubation  Consults:  Pulmonary and critical care medicine  Procedures:  6/1  ETT>  Significant Diagnostic Tests:    Micro Data:  May 26 SARS-CoV-2 positive 6/1 SARS-CoV-2 positive June 1 blood culture negative 6/2 RVP > negative  Antimicrobials:  6/1 remdesivir >  6/1, 6/3 Tocilizumab 6/2 Convalescent plasma  Interim history/subjective:  This morning, flow weaned to 15L and tolerating well. Unfortunately, patient has been intermittently delirious and pulled out IV and refused HFNC this morning, resulting in transient hypoxemia. Given haldol and started on precedex.  Objective   Blood pressure (!) 144/83, pulse 80, temperature 99 F (37.2 C), temperature source Axillary, resp. rate 15, height 5' (1.524 m), weight 64.8 kg, SpO2 93 %.    FiO2 (%):  [70 %-100 %] 70 %   Intake/Output Summary (Last 24 hours) at 04/11/2019 1324 Last data filed at 04/11/2019 0600 Gross per 24 hour  Intake 440.75 ml  Output 1655 ml  Net -1214.25 ml   Filed Weights   04/07/19 2003 04/09/19 0500 04/10/19 0400  Weight: 67.3 kg 68.6 kg 64.8 kg   Physical Exam: General: Well-appearing, agitated HENT: [place], AT, OP clear, MMM, wearing facemask Eyes: EOMI, no scleral icterus Respiratory: Clear to auscultation bilaterally.  No crackles, wheezing or rales Cardiovascular: RRR, -M/R/G, no JVD GI: BS+, soft, nontender Extremities:-Edema,-tenderness Neuro: Awake and alert, does not follow commands Skin: Intact, no rashes or bruising Psych: Agitated and anxious mood, repeating phrases including "[she] wants to die" or "is going to die" GU: Foley in place  Resolved Hospital Problem list     Assessment & Plan:   ARDS due to  COVID-19: Severe hypoxemic respiratory failure Completed Actemra Completed convalescent plasma Complete Remdesivir D5 DC steroids for agitation Continue heated HFNC for target SpO2 >88% Scheduled nebulizer Diurese VAP  Agitation: Delirium vs steroid induced DC steroids Precedex gtt PRN haldol and ativan  Hyperglycemia SSI Trend CBG  Best practice:   Diet: Hold TF Pain/Anxiety/Delirium protocol (if indicated): As above VAP protocol (if indicated): Yes DVT prophylaxis: Enoxaparin per COVID protocol GI prophylaxis: -- Glucose control: Sliding scale insulin Mobility: Bedrest Code Status: Full Family Communication: Per TRH Disposition: Remain in ICU  Labs   CBC: Recent Labs  Lab 04/07/19 1452 04/08/19 0420 04/08/19 1357 04/08/19 1558 04/08/19 1830 04/09/19 0140 04/10/19 0433  WBC 7.8 6.0  --   --   --  7.3 12.4*  NEUTROABS 7.1 5.6  --   --   --  6.5 11.0*  HGB 12.9 12.4 11.6* 12.6 11.6* 11.8* 11.8*  HCT 38.5 38.2 34.0* 37.0 34.0* 36.9 36.9  MCV 93.4 93.9  --   --   --  95.6 97.1  PLT 251 279  --   --   --  308 346    Basic Metabolic Panel: Recent Labs  Lab 04/07/19 1452 04/08/19 0420 04/08/19 1357 04/08/19 1558 04/08/19 1600 04/08/19 1830 04/09/19 0140 04/10/19 0433  NA 137 141 143 143  --  143 141 148*  K 3.2* 3.5 3.6 3.8  --  4.0 3.8 3.7  CL 105 110  --   --   --   --  113* 113*  CO2 24 22  --   --   --   --  24 28  GLUCOSE 141* 182*  --   --   --   --  204* 213*  BUN 6 8  --   --   --   --  20 30*  CREATININE 0.59 0.57  --   --   --   --  0.61 0.66  CALCIUM 8.6* 8.2*  --   --   --   --  8.2* 8.4*  MG  --  1.9  --   --  2.0  --  2.2  --   PHOS  --  2.8  --   --  4.3  --  2.8  --    GFR: Estimated Creatinine Clearance: 67.5 mL/min (by C-G formula based on SCr of 0.66 mg/dL). Recent Labs  Lab 04/07/19 1452 04/07/19 1500 04/07/19 1621 04/08/19 0420 04/09/19 0140 04/10/19 0433  PROCALCITON  --  <0.10  --  <0.10  --   --   WBC 7.8  --   --  6.0 7.3 12.4*  LATICACIDVEN  --   --  1.3  --   --   --     Liver Function Tests: Recent Labs  Lab 04/07/19 1452 04/08/19 0420 04/09/19 0140 04/10/19 0433  AST 50* 50* 38 34  ALT 40 41 37 37  ALKPHOS 70 70 63 69  BILITOT 0.5 0.3 0.2* 0.2*  PROT 7.4 6.6 6.0* 6.0*  ALBUMIN 3.4* 2.8* 2.5* 2.6*   No results for input(s): LIPASE, AMYLASE in the last 168  hours. No results for input(s): AMMONIA in the last 168 hours.  ABG    Component Value Date/Time   PHART 7.356 04/08/2019 1830   PCO2ART 44.1 04/08/2019 1830   PO2ART 211.0 (H) 04/08/2019 1830   HCO3 24.7 04/08/2019 1830   TCO2 26 04/08/2019 1830   ACIDBASEDEF 1.0 04/08/2019 1830   O2SAT 100.0 04/08/2019 1830  Coagulation Profile: No results for input(s): INR, PROTIME in the last 168 hours.  Cardiac Enzymes: Recent Labs  Lab 04/08/19 0420  CKTOTAL 66  TROPONINI <0.03    HbA1C: No results found for: HGBA1C  CBG: Recent Labs  Lab 04/10/19 1631 04/10/19 2020 04/10/19 2359 04/11/19 0812 04/11/19 1203  GLUCAP 142* 127* 143* 158* 195*   Critical care time: 38 minutes    The patient is critically ill with multiple organ systems failure and requires high complexity decision making for assessment and support, frequent evaluation and titration of therapies, application of advanced monitoring technologies and extensive interpretation of multiple databases.   Discussed and co-managed patient care with PCCM-Hospitalist. Coordinated care with RT, RN and pharmacist.  Mechele Collin, M.D. [facility] Pulmonary/Critical Care Medicine Pager: [phone] After hours pager: [phone]

## 2019-04-11 NOTE — Progress Notes (Addendum)
Pt continues to refuse all treatment and states that she wants no more medicine, no more anything. She only wants to die.

## 2019-04-11 NOTE — Progress Notes (Signed)
Attempted 6am med pass and oral care. Pt began to shake her head forcefully and swated my hand away. She refused sips of water and she refused to allow phlebotomy to draw her blood.

## 2019-04-11 NOTE — Progress Notes (Signed)
Behavorial health attempted to call patient. Patient unable to participate with care and questions even with use of interrupter. Patient is sleeping at intervals and when awake is crying, praying and stating " They are coming". Unable to reoriented patient the patient only calms down when she falls asleep. Prece dex medication titrated based on patient. Family called and again updated daugther. Will continue to monitor.

## 2019-04-11 NOTE — Progress Notes (Signed)
Nutrition Follow-up  DOCUMENTATION CODES:   Not applicable  INTERVENTION:   Ensure Enlive po TID, each supplement provides 350 kcal and 20 grams of protein  Recommend liberalizing diet to REGULAR if po intake inadequate on follow-up  Pt receiving Hormel Shake daily with Breakfast which provides 520 kcals and 22 g of protein and Magic cup BID with lunch and dinner, each supplement provides 290 kcal and 9 grams of protein, automatically on meal trays to optimize nutritional intake.    NUTRITION DIAGNOSIS:   Inadequate oral intake related to acute illness as evidenced by NPO status.  Being addressed as diet advanced, supplement  GOAL:   Patient will meet greater than or equal to 90% of their needs  Progressing  MONITOR:   PO intake, Supplement acceptance, Labs, Weight trends, Skin  REASON FOR ASSESSMENT:   Consult, Ventilator Enteral/tube feeding initiation and management  ASSESSMENT:   53 yo female with no PMH admitted with severe acute respiratory failure due to COVID-19 pneumonia.   6/02 Intubated 6/03 TF initiated 6/04 Extubated  Diet just advanced, no recorded po intake yet   Weight down to 64.8 kg, net negative 1.8 L  Labs: CBGs 127-223 Meds: ss novolog, , Vit C, Zinc  Diet Order:   Diet Order            Diet Carb Modified Fluid consistency: Thin; Room service appropriate? Yes  Diet effective now              EDUCATION NEEDS:   Not appropriate for education at this time  Skin:  Skin Assessment: Reviewed RN Assessment  Last BM:  6/03  Height:   Ht Readings from Last 1 Encounters:  04/08/19 5' (1.524 m)    Weight:   Wt Readings from Last 1 Encounters:  04/10/19 64.8 kg    Ideal Body Weight:  45.5 kg  BMI:  Body mass index is 27.91 kg/m.  Estimated Nutritional Needs:   Kcal:  1620-1820 kcals   Protein:  80-90 g   Fluid:  >/= 1.6 L    Joyce Starcher MS, RD, LDN, CNSC [phone] Pager  [phone] Weekend/On-Call  Pager

## 2019-04-11 NOTE — Progress Notes (Signed)
Patient very confused. Difficult to reorient. Patient pulled at Foley tubing , pulled out IV site and refuses all movements and oral care or liquids. MD in the room while patient was in danger of self harm and placed new orders. Spoke to daughter and son about restraint placement and mental state. Spoke to family friend Gus and made plans to speak with Zorita Pang. Will continue to monitor.

## 2019-04-12 DIAGNOSIS — G9340 Encephalopathy, unspecified: Secondary | ICD-10-CM

## 2019-04-12 DIAGNOSIS — R45851 Suicidal ideations: Secondary | ICD-10-CM

## 2019-04-12 LAB — COMPREHENSIVE METABOLIC PANEL
ALT: 38 U/L (ref 0–44)
AST: 39 U/L (ref 15–41)
Albumin: 3.2 g/dL — ABNORMAL LOW (ref 3.5–5.0)
Alkaline Phosphatase: 69 U/L (ref 38–126)
Anion gap: 8 (ref 5–15)
BUN: 31 mg/dL — ABNORMAL HIGH (ref 6–20)
CO2: 31 mmol/L (ref 22–32)
Calcium: 8.7 mg/dL — ABNORMAL LOW (ref 8.9–10.3)
Chloride: 110 mmol/L (ref 98–111)
Creatinine, Ser: 0.74 mg/dL (ref 0.44–1.00)
GFR calc Af Amer: >60 mL/min (ref >60)
GFR calc non Af Amer: >60 mL/min (ref >60)
Glucose, Bld: 147 mg/dL — ABNORMAL HIGH (ref 70–99)
Potassium: 3 mmol/L — ABNORMAL LOW (ref 3.5–5.1)
Sodium: 149 mmol/L — ABNORMAL HIGH (ref 135–145)
Total Bilirubin: 0.9 mg/dL (ref 0.3–1.2)
Total Protein: 6.8 g/dL (ref 6.5–8.1)

## 2019-04-12 LAB — CBC WITH DIFFERENTIAL/PLATELET
Abs Immature Granulocytes: 0.21 10*3/uL — ABNORMAL HIGH (ref 0.00–0.07)
Basophils Absolute: 0 10*3/uL (ref 0.0–0.1)
Basophils Relative: 0 %
Eosinophils Absolute: 0 10*3/uL (ref 0.0–0.5)
Eosinophils Relative: 0 %
HCT: 42.4 % (ref 36.0–46.0)
Hemoglobin: 13.5 g/dL (ref 12.0–15.0)
Immature Granulocytes: 2 %
Lymphocytes Relative: 9 %
Lymphs Abs: 0.9 10*3/uL (ref 0.7–4.0)
MCH: 30.2 pg (ref 26.0–34.0)
MCHC: 31.8 g/dL (ref 30.0–36.0)
MCV: 94.9 fL (ref 80.0–100.0)
Monocytes Absolute: 0.5 10*3/uL (ref 0.1–1.0)
Monocytes Relative: 5 %
Neutro Abs: 8 10*3/uL — ABNORMAL HIGH (ref 1.7–7.7)
Neutrophils Relative %: 84 %
Platelets: 374 10*3/uL (ref 150–400)
RBC: 4.47 MIL/uL (ref 3.87–5.11)
RDW: 12.6 % (ref 11.5–15.5)
WBC: 9.7 10*3/uL (ref 4.0–10.5)
nRBC: 0 % (ref 0.0–0.2)

## 2019-04-12 LAB — D-DIMER, QUANTITATIVE: D-Dimer, Quant: 1.05 ug/mL-FEU — ABNORMAL HIGH (ref 0.00–0.50)

## 2019-04-12 LAB — CULTURE, BLOOD (ROUTINE X 2)
Culture: NO GROWTH
Culture: NO GROWTH
Special Requests: ADEQUATE
Special Requests: ADEQUATE

## 2019-04-12 LAB — GLUCOSE, CAPILLARY
Glucose-Capillary: 145 mg/dL — ABNORMAL HIGH (ref 70–99)
Glucose-Capillary: 248 mg/dL — ABNORMAL HIGH (ref 70–99)
Glucose-Capillary: 229 mg/dL — ABNORMAL HIGH (ref 70–99)
Glucose-Capillary: 203 mg/dL — ABNORMAL HIGH (ref 70–99)
Glucose-Capillary: 127 mg/dL — ABNORMAL HIGH (ref 70–99)

## 2019-04-12 LAB — HEMOGLOBIN A1C
Hgb A1c MFr Bld: 6.3 % — ABNORMAL HIGH (ref 4.8–5.6)
Mean Plasma Glucose: 134.11 mg/dL

## 2019-04-12 LAB — C-REACTIVE PROTEIN: CRP: 1.4 mg/dL — ABNORMAL HIGH (ref <1.0)

## 2019-04-12 LAB — FERRITIN: Ferritin: 509 ng/mL — ABNORMAL HIGH (ref 11–307)

## 2019-04-12 MED ORDER — POTASSIUM CHLORIDE 10 MEQ/100ML IV SOLN
10.0000 meq | INTRAVENOUS | Status: AC
  Administered 2019-04-12 (×8): 10 meq via INTRAVENOUS
  Filled 2019-04-12 (×7): qty 100

## 2019-04-12 MED ORDER — HALOPERIDOL LACTATE 5 MG/ML IJ SOLN
2.0000 mg | Freq: Two times a day (BID) | INTRAMUSCULAR | Status: DC
  Administered 2019-04-12 (×2): 2 mg via INTRAVENOUS
  Filled 2019-04-12 (×3): qty 1

## 2019-04-12 MED ORDER — POTASSIUM CHLORIDE 10 MEQ/50ML IV SOLN: 10.0000 meq | INTRAVENOUS | Status: DC

## 2019-04-12 NOTE — Progress Notes (Signed)
All night, patient has been off and on precedex. If she is not on precedex she is screaming, crying, thrashing, biting, angry, and attempting to pull everything out, even with restraints. Pt pulled out foley catheter, bulb inflated and intact. Replaced new foley. When precedex is on long enough to help pt remain calm, HR drops into the 40's and 50's.  All night long pt has been off and on precedex. To balance between the two extremes.

## 2019-04-12 NOTE — Progress Notes (Signed)
Spoke with patients daughter in law, update given. Answered a series of questions that family had regarding care. Family appreciative of care being given to family member.

## 2019-04-12 NOTE — Progress Notes (Signed)
Patient daughter called. Previous nurse on the phone with her during shift change.   Family called at 9:30 updated on patient condition

## 2019-04-12 NOTE — Progress Notes (Signed)
NAME:  Joyce Mcguire, MRN:  [id number], DOB:  [birth year], LOS: 5 ADMISSION DATE:  04/07/2019, CONSULTATION DATE:  April 08, 2019 REFERRING MD:  Jarvis NewcomerGrunz, CHIEF COMPLAINT:  dyspnea   Brief History   53 year old female with no past medical history admitted for severe acute respiratory failure with hypoxemia due to COVID-19 pneumonia.  Required intubation, extubated successfully on April 10, 2019; developed suicidal ideation afterwards.   Past Medical History  "Asthma" patient says she was never diagnosed with this prior to COVID-19 diagnosis  Significant Hospital Events   June 1 admission June 2 intubation June 4 extubation June 5 psychiatry evaluation for suicidal ideation  Consults:  PCCM  Procedures:  6/1 ETT> 6/4  Significant Diagnostic Tests:    Micro Data:  May 26 SARS-CoV-2 positive 6/1 SARS-CoV-2 positive June 1 blood culture negative 6/2 RVP > negative  Antimicrobials:  6/1 remdesivir >  6/1, 6/3 Tocilizumab 6/2 Convalescent plasma   Interim history/subjective:  Oxygen weaning down overnight, remains somewhat confused, remains on heated high flow oxygen Still repeating "I want to die "  Objective   Blood pressure (!) 148/84, pulse (!) 101, temperature 99.8 F (37.7 C), temperature source Axillary, resp. rate 16, height 5' (1.524 m), weight 64.8 kg, SpO2 97 %.    FiO2 (%):  [40 %-60 %] 50 %   Intake/Output Summary (Last 24 hours) at 04/12/2019 1450 Last data filed at 04/12/2019 1448 Gross per 24 hour  Intake 1933.16 ml  Output 2715 ml  Net -781.84 ml   Filed Weights   04/07/19 2003 04/09/19 0500 04/10/19 0400  Weight: 67.3 kg 68.6 kg 64.8 kg    Examination:  General:  Resting comfortably in bed HENT: NCAT OP clear PULM: CTA B, normal effort CV: RRR, no mgr GI: BS+, soft, nontender MSK: normal bulk and tone Neuro: awake, alert, no distress, MAEW   Resolved Hospital Problem list     Assessment & Plan:  ARDS due to COVID-19 pneumonia: Severe  hypoxemic respiratory failure Continue to administer O2 to maintain O2 saturation greater than 85% Decision for intubation should be based on physical evidence of ventilatory failure such as respiratory muscle weakness, paradoxical breathing, accessory muscle use or mental status change Out of bed as tolerated Advance diet Diuresis based on physical exam, today I do not think it is necessary Remains high risk for re-intubation, remain in ICU setting today  Agitation, ICU encephalopathy Stop steroids Haldol scheduled and as needed Precedex infusion to continue for now Close monitoring as claims to be suicidal  Suicidal ideation: Appreciate psychiatry Treatment as above   Best practice:  Diet: Advance diet Pain/Anxiety/Delirium protocol (if indicated): Not applicable VAP protocol (if indicated): Not applicable DVT prophylaxis: Lovenox per COVID protocol GI prophylaxis: Not applicable Glucose control: Per triad Mobility: Out of bed Code Status: Full Family Communication: Per triad Disposition: Remain in ICU  Labs   CBC: Recent Labs  Lab 04/07/19 1452 04/08/19 0420  04/08/19 1558 04/08/19 1830 04/09/19 0140 04/10/19 0433 04/12/19 0312  WBC 7.8 6.0  --   --   --  7.3 12.4* 9.7  NEUTROABS 7.1 5.6  --   --   --  6.5 11.0* 8.0*  HGB 12.9 12.4   < > 12.6 11.6* 11.8* 11.8* 13.5  HCT 38.5 38.2   < > 37.0 34.0* 36.9 36.9 42.4  MCV 93.4 93.9  --   --   --  95.6 97.1 94.9  PLT 251 279  --   --   --  308 346 374   < > = values in this interval not displayed.    Basic Metabolic Panel: Recent Labs  Lab 04/07/19 1452 04/08/19 0420  04/08/19 1558 04/08/19 1600 04/08/19 1830 04/09/19 0140 04/10/19 0433 04/12/19 0312  NA 137 141   < > 143  --  143 141 148* 149*  K 3.2* 3.5   < > 3.8  --  4.0 3.8 3.7 3.0*  CL 105 110  --   --   --   --  113* 113* 110  CO2 24 22  --   --   --   --  24 28 31   GLUCOSE 141* 182*  --   --   --   --  204* 213* 147*  BUN 6 8  --   --   --   --   20 30* 31*  CREATININE 0.59 0.57  --   --   --   --  0.61 0.66 0.74  CALCIUM 8.6* 8.2*  --   --   --   --  8.2* 8.4* 8.7*  MG  --  1.9  --   --  2.0  --  2.2  --   --   PHOS  --  2.8  --   --  4.3  --  2.8  --   --    < > = values in this interval not displayed.   GFR: Estimated Creatinine Clearance: 67.5 mL/min (by C-G formula based on SCr of 0.74 mg/dL). Recent Labs  Lab 04/07/19 1500 04/07/19 1621 04/08/19 0420 04/09/19 0140 04/10/19 0433 04/12/19 0312  PROCALCITON <0.10  --  <0.10  --   --   --   WBC  --   --  6.0 7.3 12.4* 9.7  LATICACIDVEN  --  1.3  --   --   --   --     Liver Function Tests: Recent Labs  Lab 04/07/19 1452 04/08/19 0420 04/09/19 0140 04/10/19 0433 04/12/19 0312  AST 50* 50* 38 34 39  ALT 40 41 37 37 38  ALKPHOS 70 70 63 69 69  BILITOT 0.5 0.3 0.2* 0.2* 0.9  PROT 7.4 6.6 6.0* 6.0* 6.8  ALBUMIN 3.4* 2.8* 2.5* 2.6* 3.2*   No results for input(s): LIPASE, AMYLASE in the last 168 hours. No results for input(s): AMMONIA in the last 168 hours.  ABG    Component Value Date/Time   PHART 7.356 04/08/2019 1830   PCO2ART 44.1 04/08/2019 1830   PO2ART 211.0 (H) 04/08/2019 1830   HCO3 24.7 04/08/2019 1830   TCO2 26 04/08/2019 1830   ACIDBASEDEF 1.0 04/08/2019 1830   O2SAT 100.0 04/08/2019 1830     Coagulation Profile: No results for input(s): INR, PROTIME in the last 168 hours.  Cardiac Enzymes: Recent Labs  Lab 04/08/19 0420  CKTOTAL 66  TROPONINI <0.03    HbA1C: No results found for: HGBA1C  CBG: Recent Labs  Lab 04/11/19 1631 04/11/19 2001 04/11/19 2316 04/12/19 0749 04/12/19 1212  GLUCAP 170* 158* 128* 145* 248*     Critical care time: 31 minutes     Roselie Awkward, MD [facility] Pager: [phone] Cell: [phone] If no response, call [phone]

## 2019-04-12 NOTE — Progress Notes (Signed)
CRITICAL VALUE ALERT  Critical Value:  K 2.7  Date & Time Notied:  04/12/19@0649   Provider Notified: Dr. Roger Shelter, paged  Orders Received/Actions taken:

## 2019-04-12 NOTE — Progress Notes (Signed)
PROGRESS NOTE    Joyce Mcguire  RPR:[id number] DOB: 1966-01-27 DOA: 04/07/2019 PCP: System, Pcp Not In      Brief Narrative:  [name] is a 53 y.o. F with no significant PMHx who presented with 5 days cough, progressive SOB.  In the ER, SpO2 < 90%, required 4L [place].  CXR showed bilateral opacities, SARS-CoV-2 NAA positive.    Transferred to [facility], intubated on hospital day 2 due to tachypnea, fatigue, escalating O2 requirements.  Remdesivir started, Actemra and convalescent plasma given.  Extubated 6/4.     Assessment & Plan:  Coronavirus pneumonitis with acute hypoxic respiratory failure In setting of ongoing 2020 COVID-19 pandemic.  S/p Actemra 6/1.   S/p convalescent plasma 6/2.  S/p 5 days remdesivir and steroids  Able to wean to 20L O2 today, 40%.    -Continue VTE PPx with Lovenox, high dose -Continue Zinc and Vitamin C as able to take PO    COVID-19 Labs Recent Labs    04/10/19 0433 04/12/19 0312  DDIMER 0.44 1.05*  FERRITIN 565* 509*  CRP 4.1* 1.4*    Acute delirium Depression with ?suicidal ideation Family have provided conflicting reports of patient's mental health prior to admission.  Her daughter in law, who is the primary contact, reports she had no significant prior psychiatric history.   Psychiatry Dr. Mariea Clonts evaluated patient here and recommended low dose Zyprexa vs low dose IV Haldol if unable to take PO. -Haldol 2 mg IV BID  Hypernatremia Na elevated, stable -Trend Na  Hyperglycemia No previous history diabetes.  Glucoses elevated.  A1c pending. -Continue Lantus -Continue SSI correction insulin  Asymptomatic bacteriuria Urine culture with 100K E coli.  No symptoms or fever at present. -Monitor fever curve         MDM and disposition: The below labs and imaging reports were reviewed and summarized above.  Medication management as above.  This encounter involves a high degree of medical risk, and extensive number of diagnoses  and treatment options were managed, as well as extensive complexity of data that was involved in decision-making.  35 minutes were spent with the patient, either face-to-face around the floor with greater than 50% of the total time spent examining the patient, coordinating care, or counseling family by phone regarding her coronavirus pneumonia and ongoing delirium.   DVT prophylaxis: Lovenox Code Status: FULL     Consultants:   PCCM  Procedures:   ETT 6/2  Antimicrobials:   Remdesivir 6/1 >>    Culture data:   6/1 blood culture x1 -- NGTD  6/1 RVP -- Negative  6/2 Urine culture -- NGTD  MRSA nares -- Neg   Subjective: Still delirious, not participating in cares, refusing food and oral medicines and lab draws intermittently.  No fever, no vomiting, no complaints of dysuria.      Objective: Vitals:   04/12/19 1200 04/12/19 1300 04/12/19 1400 04/12/19 1500  BP: 131/72 131/77 (!) 148/84 (!) 143/78  Pulse: (!) 103 (!) 106 (!) 101 98  Resp: 12 20 16  (!) 22  Temp: 99.8 F (37.7 C)     TempSrc: Axillary     SpO2: 95% 97% 97% 92%  Weight:      Height:        Intake/Output Summary (Last 24 hours) at 04/12/2019 1506 Last data filed at 04/12/2019 1448 Gross per 24 hour  Intake 1921.58 ml  Output 1015 ml  Net 906.58 ml   Filed Weights   04/07/19 2003 04/09/19 0500  04/10/19 0400  Weight: 67.3 kg 68.6 kg 64.8 kg    Examination: General appearance: Well-nourished adult female, eyes closed, conversant but inattentive. HEENT: Anicteric, conjunctiva normal, lids and lashes normal. No nasal deformity, discharge, epistaxis.  Oropharynx tacky dry, no oral lesions. Skin: Warm and dry.  No jaundice.  No suspicious rashes or lesions. Cardiac: Tachycardic, regular, JVP normal, no lower extremity edema. Respiratory: Respirations shallow, lung sounds diminished, no rales or wheezes. Abdomen: Abdomen soft without tenderness to palpation, ascites, distention, or  hepatosplenomegaly. Neuro/Psych: Inattentive, does not follow commands regularly, engages partially in discussion in [place], but does not consistently attend to conversation in a linear and goal directed manner.    Data Reviewed: I have personally reviewed following labs and imaging studies:  CBC: Recent Labs  Lab 04/07/19 1452 04/08/19 0420  04/08/19 1558 04/08/19 1830 04/09/19 0140 04/10/19 0433 04/12/19 0312  WBC 7.8 6.0  --   --   --  7.3 12.4* 9.7  NEUTROABS 7.1 5.6  --   --   --  6.5 11.0* 8.0*  HGB 12.9 12.4   < > 12.6 11.6* 11.8* 11.8* 13.5  HCT 38.5 38.2   < > 37.0 34.0* 36.9 36.9 42.4  MCV 93.4 93.9  --   --   --  95.6 97.1 94.9  PLT 251 279  --   --   --  308 346 374   < > = values in this interval not displayed.   Basic Metabolic Panel: Recent Labs  Lab 04/07/19 1452 04/08/19 0420  04/08/19 1558 04/08/19 1600 04/08/19 1830 04/09/19 0140 04/10/19 0433 04/12/19 0312  NA 137 141   < > 143  --  143 141 148* 149*  K 3.2* 3.5   < > 3.8  --  4.0 3.8 3.7 3.0*  CL 105 110  --   --   --   --  113* 113* 110  CO2 24 22  --   --   --   --  24 28 31   GLUCOSE 141* 182*  --   --   --   --  204* 213* 147*  BUN 6 8  --   --   --   --  20 30* 31*  CREATININE 0.59 0.57  --   --   --   --  0.61 0.66 0.74  CALCIUM 8.6* 8.2*  --   --   --   --  8.2* 8.4* 8.7*  MG  --  1.9  --   --  2.0  --  2.2  --   --   PHOS  --  2.8  --   --  4.3  --  2.8  --   --    < > = values in this interval not displayed.   GFR: Estimated Creatinine Clearance: 67.5 mL/min (by C-G formula based on SCr of 0.74 mg/dL). Liver Function Tests: Recent Labs  Lab 04/07/19 1452 04/08/19 0420 04/09/19 0140 04/10/19 0433 04/12/19 0312  AST 50* 50* 38 34 39  ALT 40 41 37 37 38  ALKPHOS 70 70 63 69 69  BILITOT 0.5 0.3 0.2* 0.2* 0.9  PROT 7.4 6.6 6.0* 6.0* 6.8  ALBUMIN 3.4* 2.8* 2.5* 2.6* 3.2*   No results for input(s): LIPASE, AMYLASE in the last 168 hours. No results for input(s): AMMONIA in the  last 168 hours. Coagulation Profile: No results for input(s): INR, PROTIME in the last 168 hours. Cardiac Enzymes: Recent Labs  Lab 04/08/19  [place]  TROPONINI <0.03   BNP (last 3 results) No results for input(s): PROBNP in the last 8760 hours. HbA1C: No results for input(s): HGBA1C in the last 72 hours. CBG: Recent Labs  Lab 04/11/19 1631 04/11/19 2001 04/11/19 2316 04/12/19 0749 04/12/19 1212  GLUCAP 170* 158* 128* 145* 248*   Lipid Profile: No results for input(s): CHOL, HDL, LDLCALC, TRIG, CHOLHDL, LDLDIRECT in the last 72 hours. Thyroid Function Tests: No results for input(s): TSH, T4TOTAL, FREET4, T3FREE, THYROIDAB in the last 72 hours. Anemia Panel: Recent Labs    04/10/19 0433 04/12/19 0312  FERRITIN 565* 509*   Urine analysis:    Component Value Date/Time   COLORURINE YELLOW 04/08/2019 0830   APPEARANCEUR HAZY (A) 04/08/2019 0830   LABSPEC 1.023 04/08/2019 0830   PHURINE 6.0 04/08/2019 0830   GLUCOSEU NEGATIVE 04/08/2019 0830   HGBUR NEGATIVE 04/08/2019 0830   BILIRUBINUR NEGATIVE 04/08/2019 0830   KETONESUR NEGATIVE 04/08/2019 0830   PROTEINUR 30 (A) 04/08/2019 0830   NITRITE POSITIVE (A) 04/08/2019 0830   LEUKOCYTESUR MODERATE (A) 04/08/2019 0830   Sepsis Labs: @LABRCNTIP (procalcitonin:4,lacticacidven:4)  ) Recent Results (from the past 240 hour(s))  SARS Coronavirus 2 (CEPHEID- Performed in [facility] hospital lab), Hosp Order     Status: Abnormal   Collection Time: 04/07/19  2:53 PM  Result Value Ref Range Status   SARS Coronavirus 2 POSITIVE (A) NEGATIVE Final    Comment: RESULT CALLED TO, READ BACK BY AND VERIFIED WITH: Ethelda Chick [id number] @ [facility] (NOTE) If result is NEGATIVE SARS-CoV-2 target nucleic acids are NOT DETECTED. The SARS-CoV-2 RNA is generally detectable in upper and lower  respiratory specimens during the acute phase of infection. The lowest  concentration of SARS-CoV-2 viral copies this assay can  detect is 250  copies / mL. A negative result does not preclude SARS-CoV-2 infection  and should not be used as the sole basis for treatment or other  patient management decisions.  A negative result may occur with  improper specimen collection / handling, submission of specimen other  than nasopharyngeal swab, presence of viral mutation(s) within the  areas targeted by this assay, and inadequate number of viral copies  (<250 copies / mL). A negative result must be combined with clinical  observations, patient history, and epidemiological information. If result is POSITIVE SARS-CoV-2 target nucleic acids are DETEC TED. The SARS-CoV-2 RNA is generally detectable in upper and lower  respiratory specimens during the acute phase of infection.  Positive  results are indicative of active infection with SARS-CoV-2.  Clinical  correlation with patient history and other diagnostic information is  necessary to determine patient infection status.  Positive results do  not rule out bacterial infection or co-infection with other viruses. If result is PRESUMPTIVE POSTIVE SARS-CoV-2 nucleic acids MAY BE PRESENT.   A presumptive positive result was obtained on the submitted specimen  and confirmed on repeat testing.  While 2019 novel coronavirus  (SARS-CoV-2) nucleic acids may be present in the submitted sample  additional confirmatory testing may be necessary for epidemiological  and / or clinical management purposes  to differentiate between  SARS-CoV-2 and other Sarbecovirus currently known to infect humans.  If clinically indicated additional testing with an alternate test  methodology (LAB7 453) is advised. The SARS-CoV-2 RNA is generally  detectable in upper and lower respiratory specimens during the acute  phase of infection. The expected result is Negative. Fact Sheet for Patients:  StrictlyIdeas.no Fact Sheet for Healthcare  Providers: BankingDealers.co.za This test is not yet approved or cleared by the [place] and has been authorized for detection and/or diagnosis of SARS-CoV-2 by FDA under an Emergency Use Authorization (EUA).  This EUA will remain in effect (meaning this test can be used) for the duration of the COVID-19 declaration under Section 564(b)(1) of the Act, 21 U.S.C. section 360bbb-3(b)(1), unless the authorization is terminated or revoked sooner. Performed at [facility], [place] [address] [place], [place] [address]   Blood culture (routine x 2)     Status: None   Collection Time: 04/07/19  4:21 PM  Result Value Ref Range Status   Specimen Description   Final    BLOOD RIGHT HAND Performed at [facility] [address], [place], [place] [address]    Special Requests   Final    BOTTLES DRAWN AEROBIC ONLY Blood Culture adequate volume Performed at [facility] [address] [place], [place] [address]    Culture   Final    NO GROWTH 5 DAYS Performed at [facility], [place] [address] [place], [place] [address]    Report Status 04/12/2019 FINAL  Final  Blood culture (routine x 2)     Status: None   Collection Time: 04/07/19  4:26 PM  Result Value Ref Range Status   Specimen Description   Final    BLOOD RIGHT HAND Performed at [facility] [address], [place], [place] [address]    Special Requests   Final    BOTTLES DRAWN AEROBIC ONLY Blood Culture adequate volume Performed at [facility] [address] [place], [place] [address]    Culture   Final    NO GROWTH 5 DAYS Performed at [facility], [place] [address], [place], [place] [address]    Report Status 04/12/2019 FINAL  Final  MRSA PCR Screening     Status: None   Collection Time: 04/07/19 11:58 PM  Result Value Ref Range Status   MRSA by PCR NEGATIVE NEGATIVE Final    Comment:         The GeneXpert MRSA Assay (FDA approved for NASAL specimens only), is one component of a comprehensive MRSA colonization surveillance program. It is not intended to diagnose MRSA infection nor to guide or monitor treatment for MRSA infections. Performed at [facility], [place] [address] [place], [place] [address]   Urine culture     Status: Abnormal   Collection Time: 04/08/19  8:30 AM  Result Value Ref Range Status   Specimen Description   Final    URINE, RANDOM Performed at [facility] [address], [place], [place] [address]    Special Requests   Final    NONE Performed at [facility], [place] [address], [place], [place] [address]    Culture >=100,000 COLONIES/mL ESCHERICHIA COLI (A)  Final   Report Status 04/10/2019 FINAL  Final   Organism ID, Bacteria ESCHERICHIA COLI (A)  Final      Susceptibility   Escherichia coli - MIC*    AMPICILLIN 4 SENSITIVE Sensitive     CEFAZOLIN <=4 SENSITIVE Sensitive     CEFTRIAXONE <=1 SENSITIVE Sensitive     CIPROFLOXACIN <=0.25 SENSITIVE Sensitive     GENTAMICIN <=1 SENSITIVE Sensitive     IMIPENEM <=0.25 SENSITIVE Sensitive     NITROFURANTOIN <=16 SENSITIVE Sensitive     TRIMETH/SULFA <=20 SENSITIVE Sensitive  AMPICILLIN/SULBACTAM <=2 SENSITIVE Sensitive     PIP/TAZO <=4 SENSITIVE Sensitive     Extended ESBL NEGATIVE Sensitive     * >=100,000 COLONIES/mL ESCHERICHIA COLI  Respiratory Panel by PCR     Status: None   Collection Time: 04/08/19  8:45 AM  Result Value Ref Range Status   Adenovirus NOT DETECTED NOT DETECTED Final   Coronavirus 229E NOT DETECTED NOT DETECTED Final    Comment: (NOTE) The Coronavirus on the Respiratory Panel, DOES NOT test for the novel  Coronavirus (2019 nCoV)    Coronavirus HKU1 NOT DETECTED NOT DETECTED Final   Coronavirus NL63 NOT DETECTED NOT DETECTED Final   Coronavirus OC43 NOT DETECTED NOT DETECTED Final   Metapneumovirus NOT DETECTED  NOT DETECTED Final   Rhinovirus / Enterovirus NOT DETECTED NOT DETECTED Final   Influenza A NOT DETECTED NOT DETECTED Final   Influenza B NOT DETECTED NOT DETECTED Final   Parainfluenza Virus 1 NOT DETECTED NOT DETECTED Final   Parainfluenza Virus 2 NOT DETECTED NOT DETECTED Final   Parainfluenza Virus 3 NOT DETECTED NOT DETECTED Final   Parainfluenza Virus 4 NOT DETECTED NOT DETECTED Final   Respiratory Syncytial Virus NOT DETECTED NOT DETECTED Final   Bordetella pertussis NOT DETECTED NOT DETECTED Final   Chlamydophila pneumoniae NOT DETECTED NOT DETECTED Final   Mycoplasma pneumoniae NOT DETECTED NOT DETECTED Final    Comment: Performed at [facility], [address], [place], [place] [address]         Radiology Studies: No results found.      Scheduled Meds:  chlorhexidine gluconate (MEDLINE KIT)  15 mL Mouth Rinse BID   Chlorhexidine Gluconate Cloth  6 each Topical Q0600   enoxaparin (LOVENOX) injection  40 mg Subcutaneous Q12H   feeding supplement (ENSURE ENLIVE)  237 mL Oral TID BM   haloperidol lactate  2 mg Intravenous Q12H   insulin aspart  0-15 Units Subcutaneous Q4H   insulin glargine  12 Units Subcutaneous Daily   mouth rinse  15 mL Mouth Rinse 10 times per day   vitamin C  500 mg Oral Daily   zinc sulfate  220 mg Oral Daily   Continuous Infusions:  sodium chloride 10 mL/hr (04/12/19 0448)   dexmedetomidine (PRECEDEX) IV infusion 0.1 mcg/kg/hr (04/12/19 1400)   potassium chloride 10 mEq (04/12/19 1453)     LOS: 5 days    Time spent:   During this encounter: Patient Isolation: Airborne, contact, droplet HCP PPE: Face shield, head covering, N95, gown, gloves, and shoe covers    Edwin Dada, MD [facility] 04/12/2019, 3:06 PM     Please page through [place]:  [email or web address] Password [email or web address] If 7PM-7AM, please contact night-coverage

## 2019-04-13 DIAGNOSIS — J9601 Acute respiratory failure with hypoxia: Secondary | ICD-10-CM

## 2019-04-13 LAB — CBC WITH DIFFERENTIAL/PLATELET
Abs Immature Granulocytes: 0.18 10*3/uL — ABNORMAL HIGH (ref 0.00–0.07)
Basophils Absolute: 0 10*3/uL (ref 0.0–0.1)
Basophils Relative: 0 %
Eosinophils Absolute: 0.1 10*3/uL (ref 0.0–0.5)
Eosinophils Relative: 1 %
HCT: 42.8 % (ref 36.0–46.0)
Hemoglobin: 14.1 g/dL (ref 12.0–15.0)
Immature Granulocytes: 2 %
Lymphocytes Relative: 10 %
Lymphs Abs: 0.8 10*3/uL (ref 0.7–4.0)
MCH: 31.5 pg (ref 26.0–34.0)
MCHC: 32.9 g/dL (ref 30.0–36.0)
MCV: 95.5 fL (ref 80.0–100.0)
Monocytes Absolute: 0.3 10*3/uL (ref 0.1–1.0)
Monocytes Relative: 4 %
Neutro Abs: 7.1 10*3/uL (ref 1.7–7.7)
Neutrophils Relative %: 83 %
Platelets: 388 10*3/uL (ref 150–400)
RBC: 4.48 MIL/uL (ref 3.87–5.11)
RDW: 12.9 % (ref 11.5–15.5)
WBC: 8.6 10*3/uL (ref 4.0–10.5)
nRBC: 0 % (ref 0.0–0.2)

## 2019-04-13 LAB — COMPREHENSIVE METABOLIC PANEL
ALT: 121 U/L — ABNORMAL HIGH (ref 0–44)
AST: 59 U/L — ABNORMAL HIGH (ref 15–41)
Albumin: 2.9 g/dL — ABNORMAL LOW (ref 3.5–5.0)
Alkaline Phosphatase: 68 U/L (ref 38–126)
Anion gap: 8 (ref 5–15)
BUN: 21 mg/dL — ABNORMAL HIGH (ref 6–20)
CO2: 28 mmol/L (ref 22–32)
Calcium: 8.6 mg/dL — ABNORMAL LOW (ref 8.9–10.3)
Chloride: 112 mmol/L — ABNORMAL HIGH (ref 98–111)
Creatinine, Ser: 0.75 mg/dL (ref 0.44–1.00)
GFR calc Af Amer: >60 mL/min (ref >60)
GFR calc non Af Amer: >60 mL/min (ref >60)
Glucose, Bld: 195 mg/dL — ABNORMAL HIGH (ref 70–99)
Potassium: 3.3 mmol/L — ABNORMAL LOW (ref 3.5–5.1)
Sodium: 148 mmol/L — ABNORMAL HIGH (ref 135–145)
Total Bilirubin: 0.7 mg/dL (ref 0.3–1.2)
Total Protein: 6 g/dL — ABNORMAL LOW (ref 6.5–8.1)

## 2019-04-13 LAB — C-REACTIVE PROTEIN: CRP: <0.8 mg/dL (ref <1.0)

## 2019-04-13 LAB — GLUCOSE, CAPILLARY
Glucose-Capillary: 111 mg/dL — ABNORMAL HIGH (ref 70–99)
Glucose-Capillary: 152 mg/dL — ABNORMAL HIGH (ref 70–99)
Glucose-Capillary: 92 mg/dL (ref 70–99)
Glucose-Capillary: 150 mg/dL — ABNORMAL HIGH (ref 70–99)

## 2019-04-13 LAB — D-DIMER, QUANTITATIVE: D-Dimer, Quant: 0.95 ug/mL-FEU — ABNORMAL HIGH (ref 0.00–0.50)

## 2019-04-13 LAB — FERRITIN: Ferritin: 627 ng/mL — ABNORMAL HIGH (ref 11–307)

## 2019-04-13 MED ORDER — POTASSIUM CHLORIDE CRYS ER 20 MEQ PO TBCR
40.0000 meq | EXTENDED_RELEASE_TABLET | Freq: Once | ORAL | Status: AC
  Administered 2019-04-13: 40 meq via ORAL
  Filled 2019-04-13: qty 2

## 2019-04-13 MED ORDER — SODIUM CHLORIDE 0.9 % IV SOLN
1.0000 g | INTRAVENOUS | Status: AC
  Administered 2019-04-13 – 2019-04-15 (×3): 1 g via INTRAVENOUS
  Filled 2019-04-13 (×2): qty 10
  Filled 2019-04-13: qty 1

## 2019-04-13 MED ORDER — OLANZAPINE 2.5 MG PO TABS
2.5000 mg | ORAL_TABLET | Freq: Every day | ORAL | Status: DC
  Administered 2019-04-13 – 2019-04-19 (×7): 2.5 mg via ORAL
  Filled 2019-04-13 (×9): qty 1

## 2019-04-13 NOTE — Progress Notes (Signed)
PROGRESS NOTE    Joyce Mcguire  KMM:[id number] DOB: 11-05-1966 DOA: 04/07/2019 PCP: System, Pcp Not In      Brief Narrative:  [name] is a 53 y.o. F with no significant PMHx who presented with 5 days cough, progressive SOB.  In the ER, SpO2 < 90%, required 4L [place].  CXR showed bilateral opacities, SARS-CoV-2 NAA positive.    Transferred to [place], intubated on hospital day 2 due to tachypnea, fatigue, escalating O2 requirements.  Remdesivir started, Actemra and convalescent plasma given.  Extubated 6/4.     Assessment & Plan:  Coronavirus pneumonitis with acute hypoxic respiratory failure In setting of ongoing 2020 COVID-19 pandemic.  S/p Actemra 6/1.   S/p convalescent plasma 6/2.  S/p 5 days remdesivir and steroids  Inflammatory markers improving, O2 needs down to 4L this morning.  -Continue VTE PPx with Lovenox -Continue Zinc and Vitamin C  -Wean O2 as able -PT eval   COVID-19 Labs Recent Labs    04/12/19 0312 04/13/19 0544  DDIMER 1.05* 0.95*  FERRITIN 509* 627*  CRP 1.4* <0.8    Transaminitis This is new.  There is no reported abdominal pain to suggest gallbladder disease.  More likely medication induced, possibly from remdesivir.  Transaminitis with haldol usually occurs with chronic use. -Close monitoring LFTs  Acute delirium Depression with ?suicidal ideation Family had provided conflicting reports of patient's mental health prior to admission.  Her daughter in law, who is the primary contact, reports she had no significant prior psychiatric history.   Psychiatry Dr. Mariea Clonts evaluated patient here and recommended low dose Zyprexa vs low dose IV Haldol if unable to take PO.  Her delirium is improving, she is tolerant of cares, oriented this morning, ate breakfast, now sleeping again.  Treated with IV Haldol x2 days, will transition now to PO.  Plan for discontinuation if mentation improves by discharge. -Stop Haldol -Start low dose Zyprexa -If  mentation continues to improve, stop anti-psychotic at discharge  Hypernatremia Na stable -Trend Na  Hyperglycemia No previous history diabetes.  Glucoses somewhat elevated.  A1c 6.3%, pre-diabetes -Continue Lantus -Continue SSI correction insulin  Urinary tract infection This was based on urine culture at time of admission, not catheter associated.   Today new fever.  Hard to know if this fever represents anything other than her coronavirus infection, however will err on side of treating bacteriuria as UTI. -Start ceftriaxone 1g, day 1 of 3         MDM and disposition: The below labs and imaging reports reviewed and summarized above.  Medication management as above.  The encounter involves of a high degree of medical risk, and extensive number of diagnoses and treatment options were managed, as well as extensive complexity of data that was involved in decision-making.  35 minutes were spent with the patient, either face-to-face, on the floor with greater than 50% of the time spent examining the patient, coordinating care, counseling family by phone regarding her coronavirus pneumonia and delirium.   DVT prophylaxis: Lovenox Code Status: FULL     Consultants:   PCCM  Procedures:   ETT 6/2  Antimicrobials:   Remdesivir 6/1 >>    Culture data:   6/1 blood culture x1 -- NGTD  6/1 RVP -- Negative  6/2 Urine culture --E. coli, pansensitive  MRSA nares -- Neg   Subjective: Mentation improving, he ate food this morning.  Temp 100.4 this morning.  No respiratory distress.      Objective: Vitals:  04/13/19 0600 04/13/19 0645 04/13/19 0700 04/13/19 0800  BP: 92/66  117/75 132/80  Pulse: 81 92 94   Resp: (!) 22 16 (!) 26   Temp:      TempSrc:      SpO2: 95% (!) 88% 93%   Weight:      Height:        Intake/Output Summary (Last 24 hours) at 04/13/2019 0951 Last data filed at 04/13/2019 0700 Gross per 24 hour  Intake 2120.6 ml  Output 950 ml  Net 1170.6 ml    Filed Weights   04/09/19 0500 04/10/19 0400 04/13/19 0500  Weight: 68.6 kg 64.8 kg 64.1 kg    Examination: General appearance: Well-nourished adult female, eyes closed, answers questions occasionally, but does not engage in conversation. HEENT: Anicteric, conjunctiva normal, lids and lashes normal. No nasal deformity, discharge, epistaxis.  Oropharynx tacky dry, no oral lesions. Skin: Warm and dry, no jaundice, no suspicious rashes or lesions. Cardiac: Regular rate and rhythm, JVP normal, no lower extremity edema. Respiratory: Respiratory effort normal, lung sounds diminished, I do not appreciate rales or wheezes. Abdomen: Abdomen soft without tenderness palpation, ascites, or distention.  No hernias. Neuro/Psych: Inattentive, follows some commands, limited engagement with exam.    Data Reviewed: I have personally reviewed following labs and imaging studies:  CBC: Recent Labs  Lab 04/08/19 0420  04/08/19 1830 04/09/19 0140 04/10/19 0433 04/12/19 0312 04/13/19 0544  WBC 6.0  --   --  7.3 12.4* 9.7 8.6  NEUTROABS 5.6  --   --  6.5 11.0* 8.0* 7.1  HGB 12.4   < > 11.6* 11.8* 11.8* 13.5 14.1  HCT 38.2   < > 34.0* 36.9 36.9 42.4 42.8  MCV 93.9  --   --  95.6 97.1 94.9 95.5  PLT 279  --   --  308 346 374 388   < > = values in this interval not displayed.   Basic Metabolic Panel: Recent Labs  Lab 04/08/19 0420  04/08/19 1600 04/08/19 1830 04/09/19 0140 04/10/19 0433 04/12/19 0312 04/13/19 0544  NA 141   < >  --  143 141 148* 149* 148*  K 3.5   < >  --  4.0 3.8 3.7 3.0* 3.3*  CL 110  --   --   --  113* 113* 110 112*  CO2 22  --   --   --  24 28 31 28   GLUCOSE 182*  --   --   --  204* 213* 147* 195*  BUN 8  --   --   --  20 30* 31* 21*  CREATININE 0.57  --   --   --  0.61 0.66 0.74 0.75  CALCIUM 8.2*  --   --   --  8.2* 8.4* 8.7* 8.6*  MG 1.9  --  2.0  --  2.2  --   --   --   PHOS 2.8  --  4.3  --  2.8  --   --   --    < > = values in this interval not displayed.    GFR: Estimated Creatinine Clearance: 67.1 mL/min (by C-G formula based on SCr of 0.75 mg/dL). Liver Function Tests: Recent Labs  Lab 04/08/19 0420 04/09/19 0140 04/10/19 0433 04/12/19 0312 04/13/19 0544  AST 50* 38 34 39 59*  ALT 41 37 37 38 121*  ALKPHOS 70 63 69 69 68  BILITOT 0.3 0.2* 0.2* 0.9 0.7  PROT 6.6 6.0* 6.0* 6.8  6.0*  ALBUMIN 2.8* 2.5* 2.6* 3.2* 2.9*   No results for input(s): LIPASE, AMYLASE in the last 168 hours. No results for input(s): AMMONIA in the last 168 hours. Coagulation Profile: No results for input(s): INR, PROTIME in the last 168 hours. Cardiac Enzymes: Recent Labs  Lab 04/08/19 0420  CKTOTAL 66  TROPONINI <0.03   BNP (last 3 results) No results for input(s): PROBNP in the last 8760 hours. HbA1C: Recent Labs    04/12/19 0312  HGBA1C 6.3*   CBG: Recent Labs  Lab 04/12/19 1550 04/12/19 1937 04/12/19 2328 04/13/19 0331 04/13/19 0823  GLUCAP 229* 203* 127* 111* 152*   Lipid Profile: No results for input(s): CHOL, HDL, LDLCALC, TRIG, CHOLHDL, LDLDIRECT in the last 72 hours. Thyroid Function Tests: No results for input(s): TSH, T4TOTAL, FREET4, T3FREE, THYROIDAB in the last 72 hours. Anemia Panel: Recent Labs    04/12/19 0312 04/13/19 0544  FERRITIN 509* 627*   Urine analysis:    Component Value Date/Time   COLORURINE YELLOW 04/08/2019 0830   APPEARANCEUR HAZY (A) 04/08/2019 0830   LABSPEC 1.023 04/08/2019 0830   PHURINE 6.0 04/08/2019 0830   GLUCOSEU NEGATIVE 04/08/2019 0830   HGBUR NEGATIVE 04/08/2019 0830   BILIRUBINUR NEGATIVE 04/08/2019 0830   KETONESUR NEGATIVE 04/08/2019 0830   PROTEINUR 30 (A) 04/08/2019 0830   NITRITE POSITIVE (A) 04/08/2019 0830   LEUKOCYTESUR MODERATE (A) 04/08/2019 0830   Sepsis Labs: @LABRCNTIP (procalcitonin:4,lacticacidven:4)  ) Recent Results (from the past 240 hour(s))  SARS Coronavirus 2 (CEPHEID- Performed in [facility] hospital lab), Hosp Order     Status: Abnormal   Collection  Time: 04/07/19  2:53 PM  Result Value Ref Range Status   SARS Coronavirus 2 POSITIVE (A) NEGATIVE Final    Comment: RESULT CALLED TO, READ BACK BY AND VERIFIED WITH: Ethelda Chick [id number] @ [facility] (NOTE) If result is NEGATIVE SARS-CoV-2 target nucleic acids are NOT DETECTED. The SARS-CoV-2 RNA is generally detectable in upper and lower  respiratory specimens during the acute phase of infection. The lowest  concentration of SARS-CoV-2 viral copies this assay can detect is 250  copies / mL. A negative result does not preclude SARS-CoV-2 infection  and should not be used as the sole basis for treatment or other  patient management decisions.  A negative result may occur with  improper specimen collection / handling, submission of specimen other  than nasopharyngeal swab, presence of viral mutation(s) within the  areas targeted by this assay, and inadequate number of viral copies  (<250 copies / mL). A negative result must be combined with clinical  observations, patient history, and epidemiological information. If result is POSITIVE SARS-CoV-2 target nucleic acids are DETEC TED. The SARS-CoV-2 RNA is generally detectable in upper and lower  respiratory specimens during the acute phase of infection.  Positive  results are indicative of active infection with SARS-CoV-2.  Clinical  correlation with patient history and other diagnostic information is  necessary to determine patient infection status.  Positive results do  not rule out bacterial infection or co-infection with other viruses. If result is PRESUMPTIVE POSTIVE SARS-CoV-2 nucleic acids MAY BE PRESENT.   A presumptive positive result was obtained on the submitted specimen  and confirmed on repeat testing.  While 2019 novel coronavirus  (SARS-CoV-2) nucleic acids may be present in the submitted sample  additional confirmatory testing may be necessary for epidemiological  and / or clinical management purposes  to  differentiate between  SARS-CoV-2 and other Sarbecovirus currently known  to infect humans.  If clinically indicated additional testing with an alternate test  methodology (LAB7 [id number]) is advised. The SARS-CoV-2 RNA is generally  detectable in upper and lower respiratory specimens during the acute  phase of infection. The expected result is Negative. Fact Sheet for Patients:  StrictlyIdeas.no Fact Sheet for Healthcare Providers: BankingDealers.co.za This test is not yet approved or cleared by the [place] FDA and has been authorized for detection and/or diagnosis of SARS-CoV-2 by FDA under an Emergency Use Authorization (EUA).  This EUA will remain in effect (meaning this test can be used) for the duration of the COVID-19 declaration under Section 564(b)(1) of the Act, 21 U.S.C. section 360bbb-3(b)(1), unless the authorization is terminated or revoked sooner. Performed at [facility], [place] [address], [place], [place] [address]   Blood culture (routine x 2)     Status: None   Collection Time: 04/07/19  4:21 PM  Result Value Ref Range Status   Specimen Description   Final    BLOOD RIGHT HAND Performed at [facility] [address], [place], [place] [address]    Special Requests   Final    BOTTLES DRAWN AEROBIC ONLY Blood Culture adequate volume Performed at [facility] [address] [place], [place] [address]    Culture   Final    NO GROWTH 5 DAYS Performed at [facility], [place] [address] [place], [place] [address]    Report Status 04/12/2019 FINAL  Final  Blood culture (routine x 2)     Status: None   Collection Time: 04/07/19  4:26 PM  Result Value Ref Range Status   Specimen Description   Final    BLOOD RIGHT HAND Performed at [facility] [address] [place], [place] [address]    Special Requests   Final    BOTTLES DRAWN AEROBIC  ONLY Blood Culture adequate volume Performed at [facility] [address], [place], [place] [address]    Culture   Final    NO GROWTH 5 DAYS Performed at [facility], [place] [address], [place], [place] [address]    Report Status 04/12/2019 FINAL  Final  MRSA PCR Screening     Status: None   Collection Time: 04/07/19 11:58 PM  Result Value Ref Range Status   MRSA by PCR NEGATIVE NEGATIVE Final    Comment:        The GeneXpert MRSA Assay (FDA approved for NASAL specimens only), is one component of a comprehensive MRSA colonization surveillance program. It is not intended to diagnose MRSA infection nor to guide or monitor treatment for MRSA infections. Performed at [facility], [place] [address], [place], [place] [address]   Urine culture     Status: Abnormal   Collection Time: 04/08/19  8:30 AM  Result Value Ref Range Status   Specimen Description   Final    URINE, RANDOM Performed at [facility] [address] [place], [place] [address]    Special Requests   Final    NONE Performed at [facility], [place] [address], [place], [place] [address]    Culture >=100,000 COLONIES/mL ESCHERICHIA COLI (A)  Final   Report Status 04/10/2019 FINAL  Final   Organism ID, Bacteria ESCHERICHIA COLI (A)  Final      Susceptibility   Escherichia coli - MIC*    AMPICILLIN 4 SENSITIVE Sensitive     CEFAZOLIN <=4  SENSITIVE Sensitive     CEFTRIAXONE <=1 SENSITIVE Sensitive     CIPROFLOXACIN <=0.25 SENSITIVE Sensitive     GENTAMICIN <=1 SENSITIVE Sensitive     IMIPENEM <=0.25 SENSITIVE Sensitive     NITROFURANTOIN <=16 SENSITIVE Sensitive     TRIMETH/SULFA <=20 SENSITIVE Sensitive     AMPICILLIN/SULBACTAM <=2 SENSITIVE Sensitive     PIP/TAZO <=4 SENSITIVE Sensitive     Extended ESBL NEGATIVE Sensitive     * >=100,000 COLONIES/mL ESCHERICHIA COLI  Respiratory Panel by PCR     Status: None   Collection Time:  04/08/19  8:45 AM  Result Value Ref Range Status   Adenovirus NOT DETECTED NOT DETECTED Final   Coronavirus 229E NOT DETECTED NOT DETECTED Final    Comment: (NOTE) The Coronavirus on the Respiratory Panel, DOES NOT test for the novel  Coronavirus (2019 nCoV)    Coronavirus HKU1 NOT DETECTED NOT DETECTED Final   Coronavirus NL63 NOT DETECTED NOT DETECTED Final   Coronavirus OC43 NOT DETECTED NOT DETECTED Final   Metapneumovirus NOT DETECTED NOT DETECTED Final   Rhinovirus / Enterovirus NOT DETECTED NOT DETECTED Final   Influenza A NOT DETECTED NOT DETECTED Final   Influenza B NOT DETECTED NOT DETECTED Final   Parainfluenza Virus 1 NOT DETECTED NOT DETECTED Final   Parainfluenza Virus 2 NOT DETECTED NOT DETECTED Final   Parainfluenza Virus 3 NOT DETECTED NOT DETECTED Final   Parainfluenza Virus 4 NOT DETECTED NOT DETECTED Final   Respiratory Syncytial Virus NOT DETECTED NOT DETECTED Final   Bordetella pertussis NOT DETECTED NOT DETECTED Final   Chlamydophila pneumoniae NOT DETECTED NOT DETECTED Final   Mycoplasma pneumoniae NOT DETECTED NOT DETECTED Final    Comment: Performed at [facility], [place]. [address], [place], [place] [address]         Radiology Studies: No results found.      Scheduled Meds: . chlorhexidine gluconate (MEDLINE KIT)  15 mL Mouth Rinse BID  . Chlorhexidine Gluconate Cloth  6 each Topical Q0600  . enoxaparin (LOVENOX) injection  40 mg Subcutaneous Q12H  . feeding supplement (ENSURE ENLIVE)  237 mL Oral TID BM  . haloperidol lactate  2 mg Intravenous Q12H  . insulin aspart  0-15 Units Subcutaneous Q4H  . insulin glargine  12 Units Subcutaneous Daily  . mouth rinse  15 mL Mouth Rinse 10 times per day  . vitamin C  500 mg Oral Daily  . zinc sulfate  220 mg Oral Daily   Continuous Infusions: . sodium chloride 10 mL/hr at 04/13/19 0700  . cefTRIAXone (ROCEPHIN)  IV 1 g (04/13/19 0919)  . dexmedetomidine (PRECEDEX) IV infusion 0.1 mcg/kg/hr  (04/13/19 0700)     LOS: 6 days    Time spent:   During this encounter: Patient Isolation: Airborne, contact, droplet HCP PPE: Face shield, head covering, N95, gown, gloves, and shoe covers    Edwin Dada, MD [facility] 04/13/2019, 9:51 AM     Please page through [place]:  [email or web address] Password [email or web address] If 7PM-7AM, please contact night-coverage

## 2019-04-13 NOTE — Progress Notes (Signed)
NAME:  Joyce Mcguire, MRN:  [id number], DOB:  04-24-1966, LOS: 6 ADMISSION DATE:  04/07/2019, CONSULTATION DATE:  April 08, 2019 REFERRING MD:  Jarvis NewcomerGrunz, CHIEF COMPLAINT:  dyspnea   Brief History   53 year old female with no past medical history admitted for severe acute respiratory failure with hypoxemia due to COVID-19 pneumonia.  Required intubation, extubated successfully on April 10, 2019; developed suicidal ideation afterwards.   Past Medical History  "Asthma" patient says she was never diagnosed with this prior to COVID-19 diagnosis  Significant Hospital Events   June 1 admission June 2 intubation June 4 extubation June 5 psychiatry evaluation for suicidal ideation June 6 stopped steroids June 7 more cooperative, oxygenation improved  Consults:  PCCM  Procedures:  6/1 ETT> 6/4  Significant Diagnostic Tests:    Micro Data:  May 26 SARS-CoV-2 positive 6/1 SARS-CoV-2 positive June 1 blood culture negative 6/2 RVP > negative  Antimicrobials:  6/1 remdesivir >  6/1, 6/3 Tocilizumab 6/2 Convalescent plasma  Interim history/subjective:   Oxygenation has improved No longer claims to be suicidal Refuses to interact with me on exam today, nurse reports she has been cooperative this morning  Objective   Blood pressure 132/80, pulse 94, temperature (!) 100.4 F (38 C), temperature source Axillary, resp. rate (!) 26, height 5' (1.524 m), weight 64.1 kg, SpO2 93 %.    FiO2 (%):  [40 %-50 %] 40 %   Intake/Output Summary (Last 24 hours) at 04/13/2019 0803 Last data filed at 04/13/2019 0700 Gross per 24 hour  Intake 2692.15 ml  Output 950 ml  Net 1742.15 ml   Filed Weights   04/09/19 0500 04/10/19 0400 04/13/19 0500  Weight: 68.6 kg 64.8 kg 64.1 kg    Examination:  General:  Resting comfortably in bed HENT: NCAT OP clear PULM: CTA B, normal effort CV: RRR, no mgr GI: BS+, soft, nontender MSK: normal bulk and tone Neuro: sleeping but easily arouses, alert, no  distress, MAEW Psyche: not interacting with me on exam   Resolved Hospital Problem list     Assessment & Plan:  ARDS due to COVID-19 pneumonia: Severe hypoxemic respiratory failure significantly improved Continue to administer oxygen to maintain O2 saturation greater than 85% Out of bed as tolerated Advance diet Physical therapy consult, get her exercising more Diuresis as physical exam dictates  Agitation, ICU encephalopathy : improved, 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 and acute steroid effect Suicidal ideation: Change Haldol to Zyprexa Wean off Precedex infusion Suicidal precautions   PCCM will be avilable PRN   Best practice:  Diet: Advance diet Pain/Anxiety/Delirium protocol (if indicated): Not applicable VAP protocol (if indicated): Not applicable DVT prophylaxis: Lovenox per COVID protocol GI prophylaxis: Not applicable Glucose control: Per triad Mobility: Out of bed Code Status: Full Family Communication: Per triad Disposition: Remain in ICU  Labs   CBC: Recent Labs  Lab 04/08/19 0420  04/08/19 1830 04/09/19 0140 04/10/19 0433 04/12/19 0312 04/13/19 0544  WBC 6.0  --   --  7.3 12.4* 9.7 8.6  NEUTROABS 5.6  --   --  6.5 11.0* 8.0* 7.1  HGB 12.4   < > 11.6* 11.8* 11.8* 13.5 14.1  HCT 38.2   < > 34.0* 36.9 36.9 42.4 42.8  MCV 93.9  --   --  95.6 97.1 94.9 95.5  PLT 279  --   --  308 346 374 388   < > = values in this interval not displayed.    Basic Metabolic Panel: Recent  Labs  Lab 04/07/19 1452 04/08/19 0420  04/08/19 1558 04/08/19 1600 04/08/19 1830 04/09/19 0140 04/10/19 0433 04/12/19 0312  NA 137 141   < > 143  --  143 141 148* 149*  K 3.2* 3.5   < > 3.8  --  4.0 3.8 3.7 3.0*  CL 105 110  --   --   --   --  113* 113* 110  CO2 24 22  --   --   --   --  24 28 31   GLUCOSE 141* 182*  --   --   --   --  204* 213* 147*  BUN 6 8  --   --   --   --  20 30* 31*  CREATININE 0.59 0.57  --   --   --   --  0.61 0.66 0.74    CALCIUM 8.6* 8.2*  --   --   --   --  8.2* 8.4* 8.7*  MG  --  1.9  --   --  2.0  --  2.2  --   --   PHOS  --  2.8  --   --  4.3  --  2.8  --   --    < > = values in this interval not displayed.   GFR: Estimated Creatinine Clearance: 67.1 mL/min (by C-G formula based on SCr of 0.74 mg/dL). Recent Labs  Lab 04/07/19 1500 04/07/19 1621 04/08/19 0420 04/09/19 0140 04/10/19 0433 04/12/19 0312 04/13/19 0544  PROCALCITON <0.10  --  <0.10  --   --   --   --   WBC  --   --  6.0 7.3 12.4* 9.7 8.6  LATICACIDVEN  --  1.3  --   --   --   --   --     Liver Function Tests: Recent Labs  Lab 04/07/19 1452 04/08/19 0420 04/09/19 0140 04/10/19 0433 04/12/19 0312  AST 50* 50* 38 34 39  ALT 40 41 37 37 38  ALKPHOS 70 70 63 69 69  BILITOT 0.5 0.3 0.2* 0.2* 0.9  PROT 7.4 6.6 6.0* 6.0* 6.8  ALBUMIN 3.4* 2.8* 2.5* 2.6* 3.2*   No results for input(s): LIPASE, AMYLASE in the last 168 hours. No results for input(s): AMMONIA in the last 168 hours.  ABG    Component Value Date/Time   PHART 7.356 04/08/2019 1830   PCO2ART 44.1 04/08/2019 1830   PO2ART 211.0 (H) 04/08/2019 1830   HCO3 24.7 04/08/2019 1830   TCO2 26 04/08/2019 1830   ACIDBASEDEF 1.0 04/08/2019 1830   O2SAT 100.0 04/08/2019 1830     Coagulation Profile: No results for input(s): INR, PROTIME in the last 168 hours.  Cardiac Enzymes: Recent Labs  Lab 04/08/19 0420  CKTOTAL 66  TROPONINI <0.03    HbA1C: Hgb A1c MFr Bld  Date/Time Value Ref Range Status  04/12/2019 03:12 AM 6.3 (H) 4.8 - 5.6 % Final    Comment:    (NOTE) Pre diabetes:          5.7%-6.4% Diabetes:              >6.4% Glycemic control for   <7.0% adults with diabetes     CBG: Recent Labs  Lab 04/12/19 1212 04/12/19 1550 04/12/19 1937 04/12/19 2328 04/13/19 0331  GLUCAP 248* 229* 203* 127* 111*     Critical care time: n/a     Roselie Awkward, MD [facility] Pager: [phone] Cell: [phone] If no response,  call [phone]

## 2019-04-13 NOTE — Progress Notes (Signed)
Update given to patient's son and daughter in law. Family made aware of patient's respiratory progress and plans to transfer to progressive care floor today. Family is still concerned by patient's outburst of anger from the night before and statements about wanting to die. Patient screened today and assures RN she only said that because she was tired and upset about being in the hospital with Covid-19. Attending paged and made aware of families concerns. MD will not initiate 1:1 sitter at this time.  Report given to progressive care, RN. Above information relayed. Patient transported via bed and oxygen to unit.

## 2019-04-13 NOTE — Progress Notes (Signed)
Patient woke up this morning and stated that she was hungry. Given orange juice and pudding. Able to eat by herself with set up. Patient more alert this morning and able to communicate. Is not happy about being in the hospital. Wants to go home. Does not want to talk to her family at this time.

## 2019-04-13 NOTE — Progress Notes (Signed)
Spoke with pts son and daughter in law with pt, all questions concerns answered. Pt unable to locate cell phone at this time, family said she had it while in ICU. Looked throughout pts room and called ICU charge to see if they could locate it.

## 2019-04-13 NOTE — Progress Notes (Signed)
Verbal order by Dr. Loleta Books to remove foley catheter and do intermittent straight catheter should retention occur. Foley removed at 1200, patient DTV.

## 2019-04-14 LAB — GLUCOSE, CAPILLARY
Glucose-Capillary: 107 mg/dL — ABNORMAL HIGH (ref 70–99)
Glucose-Capillary: 137 mg/dL — ABNORMAL HIGH (ref 70–99)
Glucose-Capillary: 139 mg/dL — ABNORMAL HIGH (ref 70–99)
Glucose-Capillary: 152 mg/dL — ABNORMAL HIGH (ref 70–99)
Glucose-Capillary: 157 mg/dL — ABNORMAL HIGH (ref 70–99)
Glucose-Capillary: 186 mg/dL — ABNORMAL HIGH (ref 70–99)
Glucose-Capillary: 203 mg/dL — ABNORMAL HIGH (ref 70–99)

## 2019-04-14 LAB — BASIC METABOLIC PANEL
Anion gap: 9 (ref 5–15)
BUN: 17 mg/dL (ref 6–20)
CO2: 26 mmol/L (ref 22–32)
Calcium: 8.4 mg/dL — ABNORMAL LOW (ref 8.9–10.3)
Chloride: 110 mmol/L (ref 98–111)
Creatinine, Ser: 0.71 mg/dL (ref 0.44–1.00)
GFR calc Af Amer: >60 mL/min (ref >60)
GFR calc non Af Amer: >60 mL/min (ref >60)
Glucose, Bld: 105 mg/dL — ABNORMAL HIGH (ref 70–99)
Potassium: 3.7 mmol/L (ref 3.5–5.1)
Sodium: 145 mmol/L (ref 135–145)

## 2019-04-14 LAB — CREATININE, SERUM
Creatinine, Ser: 0.71 mg/dL (ref 0.44–1.00)
GFR calc Af Amer: >60 mL/min (ref >60)
GFR calc non Af Amer: >60 mL/min (ref >60)

## 2019-04-14 LAB — MAGNESIUM: Magnesium: 2.3 mg/dL (ref 1.7–2.4)

## 2019-04-14 LAB — BRAIN NATRIURETIC PEPTIDE: B Natriuretic Peptide: 19.3 pg/mL (ref 0.0–100.0)

## 2019-04-14 MED ORDER — INSULIN ASPART 100 UNIT/ML ~~LOC~~ SOLN
0.0000 [IU] | Freq: Three times a day (TID) | SUBCUTANEOUS | Status: DC
  Administered 2019-04-14 (×2): 3 [IU] via SUBCUTANEOUS
  Administered 2019-04-15: 2 [IU] via SUBCUTANEOUS
  Administered 2019-04-16 (×3): 3 [IU] via SUBCUTANEOUS
  Administered 2019-04-17 (×2): 2 [IU] via SUBCUTANEOUS
  Administered 2019-04-18: 5 [IU] via SUBCUTANEOUS
  Administered 2019-04-19: 2 [IU] via SUBCUTANEOUS
  Administered 2019-04-19: 8 [IU] via SUBCUTANEOUS

## 2019-04-14 MED ORDER — LIVING WELL WITH DIABETES BOOK - IN SPANISH
Freq: Once | Status: AC
  Administered 2019-04-15: 10:00:00
  Filled 2019-04-14: qty 1

## 2019-04-14 NOTE — Progress Notes (Signed)
PROGRESS NOTE                                                                                                                                                                                                             Patient Demographics:    Joyce Mcguire, is a 53 y.o. female, DOB - 04-26-1966, HWE:[id number]  Outpatient Primary MD for the patient is System, Pcp Not In    LOS - 7  Admit date - 04/07/2019    Chief Complaint  Patient presents with  . Shortness of Breath       Brief Narrative  Joyce Mcguire is a 53 y.o. F with no significant PMHx who presented with 5 days cough, progressive SOB.  In the ER, SpO2 < 90%, required 4L [place].  CXR showed bilateral opacities, SARS-CoV-2 NAA positive.  Transferred to [facility], intubated on hospital day 2 due to tachypnea, fatigue, escalating O2 requirements. She was given Remdesivir, Actemra and convalescent plasma with improvement.   Subjective:    Joyce Mcguire today has, No headache, No chest pain, No abdominal pain - No Nausea, No new weakness tingling or numbness, No Cough - SOB.     Assessment  & Plan :     1. Acute Hypoxic Resp. Failure due to Acute Covid 19 Viral Illness during the ongoing 2020 Covid 19 Pandemic - requiring Intubation and Vent support, received Remdesivir, Actemra, Steroids & Plasma, much improved, extubated & on [place] 02, advance activity, Pulm toiletry, IS + Flutter valve, monitor.  ABG     Component Value Date/Time   PHART 7.356 04/08/2019 1830   PCO2ART 44.1 04/08/2019 1830   PO2ART 211.0 (H) 04/08/2019 1830   HCO3 24.7 04/08/2019 1830   TCO2 26 04/08/2019 1830   ACIDBASEDEF 1.0 04/08/2019 1830   O2SAT 100.0 04/08/2019 1830    COVID-19 Mcguire  Recent Mcguire    04/12/19 0312 04/13/19 0544  DDIMER 1.05* 0.95*  FERRITIN 509* 627*  CRP 1.4* <0.8    Lab Results  Component Value Date   SARSCOV2NAA POSITIVE (A) 04/07/2019     Hepatic Function  Latest Ref Rng & Units 04/13/2019 04/12/2019 04/10/2019  Total Protein 6.5 - 8.1 g/dL 6.0(L) 6.8 6.0(L)  Albumin 3.5 - 5.0 g/dL 2.9(L) 3.2(L) 2.6(L)  AST 15 - 41 U/L 59(H) 39 34  ALT 0 - 44 U/L 121(H)  38 37  Alk Phosphatase 38 - 126 U/L 68 69 69  Total Bilirubin 0.3 - 1.2 mg/dL 0.7 0.9 0.2(L)        Component Value Date/Time   BNP 19.3 04/14/2019 0735      2.  Transaminitis -  Due to combination of Covid + Actemra, no pain, follow trend.  3.  Toxic Encephalopathy - resolved.  4. UTI - pan sensitive Ecoli, on Rocephin,   5. 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   Lab Results  Component Value Date   HGBA1C 6.3 (H) 04/12/2019    CBG (last 3)  Recent Mcguire    04/13/19 2341 04/14/19 0413 04/14/19 0840  GLUCAP 139* 107* 137*    Condition - Fair  Family Communication  :  None  Code Status :  Full  Diet :   Diet Order            Diet Carb Modified Fluid consistency: Thin; Room service appropriate? Yes  Diet effective now               Disposition Plan  :  Home 2-3 days  Consults  :  PCCM  Procedures  :  None  PUD Prophylaxis : None  DVT Prophylaxis  :  Lovenox    Lab Results  Component Value Date   PLT 388 04/13/2019    Inpatient Medications  Scheduled Meds: . chlorhexidine gluconate (MEDLINE KIT)  15 mL Mouth Rinse BID  . Chlorhexidine Gluconate Cloth  6 each Topical Q0600  . enoxaparin (LOVENOX) injection  40 mg Subcutaneous Q12H  . feeding supplement (ENSURE ENLIVE)  237 mL Oral TID BM  . insulin aspart  0-15 Units Subcutaneous TID WC  . insulin glargine  12 Units Subcutaneous Daily  . OLANZapine  2.5 mg Oral QHS  . vitamin C  500 mg Oral Daily  . zinc sulfate  220 mg Oral Daily   Continuous Infusions: . sodium chloride Stopped (04/13/19 1300)  . cefTRIAXone (ROCEPHIN)  IV 1 g (04/13/19 0919)   PRN Meds:.sodium chloride, acetaminophen, albuterol, ALPRAZolam, guaiFENesin-dextromethorphan,  HYDROcodone-acetaminophen, ondansetron **OR** ondansetron (ZOFRAN) IV, polyethylene glycol  Antibiotics  :    Anti-infectives (From admission, onward)   Start     Dose/Rate Route Frequency Ordered Stop   04/13/19 1000  cefTRIAXone (ROCEPHIN) 1 g in sodium chloride 0.9 % 100 mL IVPB     1 g 200 mL/hr over 30 Minutes Intravenous Every 24 hours 04/13/19 0758 04/16/19 0959   04/08/19 2345  remdesivir 100 mg in sodium chloride 0.9 % 230 mL IVPB     100 mg 500 mL/hr over 30 Minutes Intravenous Every 24 hours 04/07/19 2330 04/11/19 2340   04/07/19 2345  remdesivir 200 mg in sodium chloride 0.9 % 210 mL IVPB     200 mg 500 mL/hr over 30 Minutes Intravenous Once 04/07/19 2330 04/08/19 0037       Time Spent in minutes  30   Lala Lund M.D on 04/14/2019 at 10:11 AM  To page go to [email or web address] - password [facility]  [facility] -  Office  [phone]  See all Orders from today for further details    Objective:   Vitals:   04/14/19 0400 04/14/19 0500 04/14/19 0800 04/14/19 1008  BP: 113/75  116/75   Pulse: 86     Resp: (!) 27     Temp: 99.1 F (37.3 C)   98.6 F (37 C)  TempSrc:    Oral  SpO2: 92%     Weight:  59.8 kg    Height:        Wt Readings from Last 3 Encounters:  04/14/19 59.8 kg     Intake/Output Summary (Last 24 hours) at 04/14/2019 1011 Last data filed at 04/14/2019 0000 Gross per 24 hour  Intake 490.02 ml  Output 175 ml  Net 315.02 ml     Physical Exam  Awake Alert, Oriented X 3, No new F.N deficits, Normal affect [place].AT,PERRAL Supple Neck,No JVD, No cervical lymphadenopathy appriciated.  Symmetrical Chest wall movement, Good air movement bilaterally, CTAB RRR,No Gallops,Rubs or new Murmurs, No Parasternal Heave +ve B.Sounds, Abd Soft, No tenderness, No organomegaly appriciated, No rebound - guarding or rigidity. No Cyanosis, Clubbing or edema, No new Rash or bruise     Data Review:    CBC Recent Mcguire  Lab 04/08/19 0420  04/08/19 1830  04/09/19 0140 04/10/19 0433 04/12/19 0312 04/13/19 0544  WBC 6.0  --   --  7.3 12.4* 9.7 8.6  HGB 12.4   < > 11.6* 11.8* 11.8* 13.5 14.1  HCT 38.2   < > 34.0* 36.9 36.9 42.4 42.8  PLT 279  --   --  308 346 374 388  MCV 93.9  --   --  95.6 97.1 94.9 95.5  MCH 30.5  --   --  30.6 31.1 30.2 31.5  MCHC 32.5  --   --  32.0 32.0 31.8 32.9  RDW 12.7  --   --  13.0 13.2 12.6 12.9  LYMPHSABS 0.3*  --   --  0.6* 0.6* 0.9 0.8  MONOABS 0.1  --   --  0.2 0.6 0.5 0.3  EOSABS 0.0  --   --  0.0 0.0 0.0 0.1  BASOSABS 0.0  --   --  0.0 0.0 0.0 0.0   < > = values in this interval not displayed.    Chemistries  Recent Mcguire  Lab 04/08/19 0420  04/08/19 1600  04/09/19 0140 04/10/19 0433 04/12/19 0312 04/13/19 0544 04/14/19 0340 04/14/19 0734  NA 141   < >  --    < > 141 148* 149* 148*  --  145  K 3.5   < >  --    < > 3.8 3.7 3.0* 3.3*  --  3.7  CL 110  --   --   --  113* 113* 110 112*  --  110  CO2 22  --   --   --  24 28 31 28   --  26  GLUCOSE 182*  --   --   --  204* 213* 147* 195*  --  105*  BUN 8  --   --   --  20 30* 31* 21*  --  17  CREATININE 0.57  --   --   --  0.61 0.66 0.74 0.75 0.71 0.71  CALCIUM 8.2*  --   --   --  8.2* 8.4* 8.7* 8.6*  --  8.4*  MG 1.9  --  2.0  --  2.2  --   --   --   --  2.3  AST 50*  --   --   --  38 34 39 59*  --   --   ALT 41  --   --   --  37 37 38 121*  --   --   ALKPHOS 70  --   --   --  63 69 69 68  --   --   BILITOT 0.3  --   --   --  0.2* 0.2* 0.9 0.7  --   --    < > = values in this interval not displayed.   ------------------------------------------------------------------------------------------------------------------ No results for input(s): CHOL, HDL, LDLCALC, TRIG, CHOLHDL, LDLDIRECT in the last 72 hours.  Lab Results  Component Value Date   HGBA1C 6.3 (H) 04/12/2019   ------------------------------------------------------------------------------------------------------------------ No results for input(s): TSH, T4TOTAL, T3FREE, THYROIDAB  in the last 72 hours.  Invalid input(s): FREET3  Cardiac Enzymes Recent Mcguire  Lab 04/08/19 0420  TROPONINI <0.03   ------------------------------------------------------------------------------------------------------------------    Component Value Date/Time   BNP 19.3 04/14/2019 0735    Micro Results Recent Results (from the past 240 hour(s))  SARS Coronavirus 2 (CEPHEID- Performed in [facility] hospital lab), Hosp Order     Status: Abnormal   Collection Time: 04/07/19  2:53 PM  Result Value Ref Range Status   SARS Coronavirus 2 POSITIVE (A) NEGATIVE Final    Comment: RESULT CALLED TO, READ BACK BY AND VERIFIED WITH: Ethelda Chick [phone] @ [facility] (NOTE) If result is NEGATIVE SARS-CoV-2 target nucleic acids are NOT DETECTED. The SARS-CoV-2 RNA is generally detectable in upper and lower  respiratory specimens during the acute phase of infection. The lowest  concentration of SARS-CoV-2 viral copies this assay can detect is 250  copies / mL. A negative result does not preclude SARS-CoV-2 infection  and should not be used as the sole basis for treatment or other  patient management decisions.  A negative result may occur with  improper specimen collection / handling, submission of specimen other  than nasopharyngeal swab, presence of viral mutation(s) within the  areas targeted by this assay, and inadequate number of viral copies  (<250 copies / mL). A negative result must be combined with clinical  observations, patient history, and epidemiological information. If result is POSITIVE SARS-CoV-2 target nucleic acids are DETEC TED. The SARS-CoV-2 RNA is generally detectable in upper and lower  respiratory specimens during the acute phase of infection.  Positive  results are indicative of active infection with SARS-CoV-2.  Clinical  correlation with patient history and other diagnostic information is  necessary to determine patient infection status.  Positive  results do  not rule out bacterial infection or co-infection with other viruses. If result is PRESUMPTIVE POSTIVE SARS-CoV-2 nucleic acids MAY BE PRESENT.   A presumptive positive result was obtained on the submitted specimen  and confirmed on repeat testing.  While 2019 novel coronavirus  (SARS-CoV-2) nucleic acids may be present in the submitted sample  additional confirmatory testing may be necessary for epidemiological  and / or clinical management purposes  to differentiate between  SARS-CoV-2 and other Sarbecovirus currently known to infect humans.  If clinically indicated additional testing with an alternate test  methodology (LAB7 [id number]) is advised. The SARS-CoV-2 RNA is generally  detectable in upper and lower respiratory specimens during the acute  phase of infection. The expected result is Negative. Fact Sheet for Patients:  StrictlyIdeas.no Fact Sheet for Healthcare Providers: BankingDealers.co.za This test is not yet approved or cleared by the [place] FDA and has been authorized for detection and/or diagnosis of SARS-CoV-2 by FDA under an Emergency Use Authorization (EUA).  This EUA will remain in effect (meaning this test can be used) for the duration of the COVID-19 declaration under Section 564(b)(1) of the Act, 21 U.S.C. section 360bbb-3(b)(1), unless the authorization is  terminated or revoked sooner. Performed at [facility], [place] [address] [place], [place] [address]   Blood culture (routine x 2)     Status: None   Collection Time: 04/07/19  4:21 PM  Result Value Ref Range Status   Specimen Description   Final    BLOOD RIGHT HAND Performed at [facility] [address] [place], [place] [address]    Special Requests   Final    BOTTLES DRAWN AEROBIC ONLY Blood Culture adequate volume Performed at [place] [address], [place], [place] [address]     Culture   Final    NO GROWTH 5 DAYS Performed at [facility], [place] [address] [place], [place] [address]    Report Status 04/12/2019 FINAL  Final  Blood culture (routine x 2)     Status: None   Collection Time: 04/07/19  4:26 PM  Result Value Ref Range Status   Specimen Description   Final    BLOOD RIGHT HAND Performed at [facility] [address], [place], [place] [address]    Special Requests   Final    BOTTLES DRAWN AEROBIC ONLY Blood Culture adequate volume Performed at [facility] [address] [place], [place] [address]    Culture   Final    NO GROWTH 5 DAYS Performed at [facility], [place] [address] [place], [place] [address]    Report Status 04/12/2019 FINAL  Final  MRSA PCR Screening     Status: None   Collection Time: 04/07/19 11:58 PM  Result Value Ref Range Status   MRSA by PCR NEGATIVE NEGATIVE Final    Comment:        The GeneXpert MRSA Assay (FDA approved for NASAL specimens only), is one component of a comprehensive MRSA colonization surveillance program. It is not intended to diagnose MRSA infection nor to guide or monitor treatment for MRSA infections. Performed at [facility], [place] [address], [place], [place] [address]   Urine culture     Status: Abnormal   Collection Time: 04/08/19  8:30 AM  Result Value Ref Range Status   Specimen Description   Final    URINE, RANDOM Performed at [place] [address], [place], [place] [address]    Special Requests   Final    NONE Performed at [id number], [place] [address] [place], [place] [address]    Culture >=100,000 COLONIES/mL ESCHERICHIA COLI (A)  Final   Report Status 04/10/2019 FINAL  Final   Organism ID, Bacteria ESCHERICHIA COLI (A)  Final      Susceptibility   Escherichia coli - MIC*    AMPICILLIN 4 SENSITIVE Sensitive     CEFAZOLIN <=4 SENSITIVE Sensitive     CEFTRIAXONE  <=1 SENSITIVE Sensitive     CIPROFLOXACIN <=0.25 SENSITIVE Sensitive     GENTAMICIN <=1 SENSITIVE Sensitive     IMIPENEM <=0.25 SENSITIVE Sensitive     NITROFURANTOIN <=16 SENSITIVE Sensitive     TRIMETH/SULFA <=20 SENSITIVE Sensitive     AMPICILLIN/SULBACTAM <=2 SENSITIVE Sensitive     PIP/TAZO <=4 SENSITIVE Sensitive     Extended ESBL NEGATIVE Sensitive     * >=100,000 COLONIES/mL ESCHERICHIA COLI  Respiratory Panel by PCR     Status: None   Collection Time: 04/08/19  8:45 AM  Result Value Ref Range Status   Adenovirus NOT DETECTED NOT DETECTED Final   Coronavirus 229E  NOT DETECTED NOT DETECTED Final    Comment: (NOTE) The Coronavirus on the Respiratory Panel, DOES NOT test for the novel  Coronavirus (2019 nCoV)    Coronavirus HKU1 NOT DETECTED NOT DETECTED Final   Coronavirus NL63 NOT DETECTED NOT DETECTED Final   Coronavirus OC43 NOT DETECTED NOT DETECTED Final   Metapneumovirus NOT DETECTED NOT DETECTED Final   Rhinovirus / Enterovirus NOT DETECTED NOT DETECTED Final   Influenza A NOT DETECTED NOT DETECTED Final   Influenza B NOT DETECTED NOT DETECTED Final   Parainfluenza Virus 1 NOT DETECTED NOT DETECTED Final   Parainfluenza Virus 2 NOT DETECTED NOT DETECTED Final   Parainfluenza Virus 3 NOT DETECTED NOT DETECTED Final   Parainfluenza Virus 4 NOT DETECTED NOT DETECTED Final   Respiratory Syncytial Virus NOT DETECTED NOT DETECTED Final   Bordetella pertussis NOT DETECTED NOT DETECTED Final   Chlamydophila pneumoniae NOT DETECTED NOT DETECTED Final   Mycoplasma pneumoniae NOT DETECTED NOT DETECTED Final    Comment: Performed at [facility], [place] [address], [place], [place] [address]    Radiology Reports Dg Abd 1 View  Result Date: 04/08/2019 CLINICAL DATA:  OG tube placement. EXAM: ABDOMEN - 1 VIEW COMPARISON:  None. FINDINGS: OG tube is in place with the tip in the distal stomach or first portion of the duodenum. IMPRESSION: As above. Electronically Signed   By:  Inge Rise M.D.   On: 04/08/2019 15:05   Dg Chest Port 1 View  Result Date: 04/08/2019 CLINICAL DATA:  Multifocal pneumonia.  COVID Positive. EXAM: PORTABLE CHEST 1 VIEW COMPARISON:  04/07/2019 FINDINGS: Endotracheal tube tip is 11 mm above the carina. NG tube tip is in the distal stomach or duodenal bulb. The patient has extensive bilateral hazy pulmonary infiltrates which appear less prominent than on the prior study but the aeration is improved in both lungs as compared to the prior exam. Heart size and vascularity are normal. No effusions. Bones are normal. IMPRESSION: 1. Extensive bilateral pulmonary infiltrates consistent with the patient's history of COVID-19. 2. Endotracheal tube is 11 mm above the carina and could be slightly retracted. Electronically Signed   By: Lorriane Shire M.D.   On: 04/08/2019 15:06   Dg Chest Port 1 View  Result Date: 04/07/2019 CLINICAL DATA:  Shortness of breath since last Friday, cough, chest pain, history asthma, reported COVID-19 positive test at another hospital EXAM: PORTABLE CHEST 1 VIEW COMPARISON:  Portable exam 1455 hours without priors for comparison FINDINGS: Upper normal heart size. Normal mediastinal contours. Patchy BILATERAL pulmonary infiltrates likely infectious. No definite pleural effusion or pneumothorax. Osseous structures unremarkable. IMPRESSION: Patchy BILATERAL pulmonary infiltrates consistent with pneumonia, including atypical etiologies. Electronically Signed   By: Lavonia Dana M.D.   On: 04/07/2019 15:34

## 2019-04-14 NOTE — Progress Notes (Signed)
Pt able to talk with daughter in law.  Pt does not want to facetime.  Update given.

## 2019-04-14 NOTE — Progress Notes (Signed)
Update called to pt's daughter, Gerald Stabs. She had questions regarding her mom's condition if she is improving. Daughter updated that her mother's oxygen needs have lessened although she is still desatting with activity. Daughter asked about potential discharge date and is asking about if a SW will be reaching out to her. Daughter appreciated update and information given.

## 2019-04-14 NOTE — Evaluation (Signed)
Physical Therapy Evaluation Patient Details Name: Joyce SlickMaria Swatek MRN: [id number] DOB: 03-Nov-1966 Today's Date: 04/14/2019   History of Present Illness  53 y.o.F with no significant PMHx who presented with 5 days cough, progressive SOB.  In the ER, SpO2 <90%, required 4L [place]. SARS-CoV-2 NAA positive. Transferred to [facility], intubated 04/08/19 due to tachypnea, fatigue, escalating O2 requirements. She was givenRemdesivir, Actemra and convalescent plasma with improvement. Extubated 04/10/19  Clinical Impression   Pt admitted with above diagnosis. Very limited tolerance for activity/standing today due to coughing, incr HR, and decr SaO2. Educated on bed level and chair level exercises she can do between sessions of walking with nursing. Pt currently with functional limitations due to the deficits listed below (see PT Problem List). Hopeful for quick progress with her balance so she will not need DME on discharge. Pt will benefit from skilled PT to increase their independence and safety with mobility to allow discharge to the venue listed below.       Follow Up Recommendations Home health PT;Supervision/Assistance - 24 hour(will continue to assess HH needs)    Equipment Recommendations  None recommended by PT    Recommendations for Other Services OT consult(entered)     Precautions / Restrictions Precautions Precautions: Fall      Mobility  Bed Mobility Overal bed mobility: Modified Independent                Transfers Overall transfer level: Needs assistance Equipment used: None Transfers: Sit to/from [employer] CorporationStand;Stand Pivot Transfers Sit to Stand: Min guard Stand pivot transfers: Min assist       General transfer comment: pivot to left, pivot to right both with staggering LOB; denied dizziness "just weak"  Ambulation/Gait             General Gait Details: deferred due to incr coughing, HR up to 122 and SaO2 down to 84% on 5L with on/off BSC  Stairs             Wheelchair Mobility    Modified Rankin (Stroke Patients Only)       Balance Overall balance assessment: Needs assistance Sitting-balance support: No upper extremity supported;Feet supported Sitting balance-Leahy Scale: Fair     Standing balance support: No upper extremity supported;During functional activity Standing balance-Leahy Scale: Poor Standing balance comment: stagger step with pivot                             Pertinent Vitals/Pain Pain Assessment: No/denies pain    Home Living Family/patient expects to be discharged to:: Private residence Living Arrangements: Spouse/significant other;Children(23 & 55 yo dtrs) Available Help at Discharge: Family Type of Home: Mobile home Home Access: Stairs to enter Entrance Stairs-Rails: Right Entrance Stairs-Number of Steps: 3 Home Layout: One level Home Equipment: None      Prior Function Level of Independence: Independent         Comments: works at [employer] "a lot of walking"     Higher education careers adviserHand Dominance        Extremity/Trunk Assessment   Upper Extremity Assessment Upper Extremity Assessment: Defer to OT evaluation    Lower Extremity Assessment Lower Extremity Assessment: Generalized weakness    Cervical / Trunk Assessment Cervical / Trunk Assessment: Normal  Communication   Communication: No difficulties  Cognition Arousal/Alertness: Awake/alert Behavior During Therapy: WFL for tasks assessed/performed Overall Cognitive Status: Within Functional Limits for tasks assessed  General Comments General comments (skin integrity, edema, etc.): Educated on importance of exercise and probable incr cough (esp with UE exercises). Educated to do 2-5 reps of each exercise and stop/rest and then resume. Educated in pursed lip breathing with delayed improvedment of SaO2 (with good waveform)    Exercises Other Exercises Other Exercises: Given packet with  theraband ex's for UEs (issued level 1 band) and supine, then sitting LE ex's. Also added bridging. Patient very appreciative to have something to work on   Assessment/Plan    PT Assessment Patient needs continued PT services  PT Problem List Decreased strength;Decreased activity tolerance;Decreased balance;Decreased mobility;Decreased knowledge of use of DME;Cardiopulmonary status limiting activity       PT Treatment Interventions DME instruction;Gait training;Functional mobility training;Therapeutic activities;Therapeutic exercise;Balance training;Patient/family education    PT Goals (Current goals can be found in the Care Plan section)  Acute Rehab PT Goals Patient Stated Goal: get well and go home soon PT Goal Formulation: With patient Time For Goal Achievement: 04/28/19 Potential to Achieve Goals: Good    Frequency Min 3X/week   Barriers to discharge        Co-evaluation               AM-PAC PT "6 Clicks" Mobility  Outcome Measure Help needed turning from your back to your side while in a flat bed without using bedrails?: None Help needed moving from lying on your back to sitting on the side of a flat bed without using bedrails?: None Help needed moving to and from a bed to a chair (including a wheelchair)?: A Little Help needed standing up from a chair using your arms (e.g., wheelchair or bedside chair)?: A Little Help needed to walk in hospital room?: A Little Help needed climbing 3-5 steps with a railing? : A Lot 6 Click Score: 19    End of Session Equipment Utilized During Treatment: Oxygen Activity Tolerance: Treatment limited secondary to medical complications (Comment)(coughing, decr SaO2, incr HR) Patient left: in bed;with call bell/phone within reach;with bed alarm set Nurse Communication: Mobility status;Other (comment)(limited tolerance for activity) PT Visit Diagnosis: Unsteadiness on feet (R26.81);Muscle weakness (generalized) (M62.81)    Time:  1425-1500 PT Time Calculation (min) (ACUTE ONLY): 35 min   Charges:   PT Evaluation $PT Eval Low Complexity: 1 Low PT Treatments $Therapeutic Activity: 8-22 mins          [employer], PT 04/14/2019, 3:13 PM

## 2019-04-14 NOTE — Progress Notes (Signed)
Inpatient Diabetes Program Recommendations  AACE/ADA: New Consensus Statement on Inpatient Glycemic Control (2015)  Target Ranges:  Prepandial:   less than 140 mg/dL      Peak postprandial:   less than 180 mg/dL (1-2 hours)      Critically ill patients:  140 - 180 mg/dL   Lab Results  Component Value Date   GLUCAP 157 (H) 04/14/2019   HGBA1C 6.3 (H) 04/12/2019    Review of Glycemic Control  Diabetes history: None Outpatient Diabetes medications: N/A Current orders for Inpatient glycemic control: Lantus 12 units QD, Novolog 0-15 units tidwc  HgbA1C - 6.3% - pre-diabetes Ordered Living Well with Diabetes book for education. RN to allow pt to stick finger for glucose monitoring. Will need to f/u with PCP for management of pre-diabetes. Lifestyle modification is first line therapy with pre-diabets.  Inpatient Diabetes Program Recommendations:     For discharge: Metformin 500 mg bid. Lifestyle modifications with diet and exercise to control blood sugars.   Will follow.  Thank you. Lorenda Peck, RD, LDN, CDE Inpatient Diabetes Coordinator [phone]

## 2019-04-14 NOTE — Progress Notes (Signed)
[facility] and [facility] Team  ReDS Research Project   Patient #: [id number]  ReDS Measurement  Right: 52%  Left: 53%

## 2019-04-15 ENCOUNTER — Inpatient Hospital Stay (HOSPITAL_COMMUNITY): Payer: BC Managed Care – PPO

## 2019-04-15 LAB — CBC WITH DIFFERENTIAL/PLATELET
Abs Immature Granulocytes: 0.11 10*3/uL — ABNORMAL HIGH (ref 0.00–0.07)
Basophils Absolute: 0 10*3/uL (ref 0.0–0.1)
Basophils Relative: 0 %
Eosinophils Absolute: 0.2 10*3/uL (ref 0.0–0.5)
Eosinophils Relative: 2 %
HCT: 41.8 % (ref 36.0–46.0)
Hemoglobin: 13.7 g/dL (ref 12.0–15.0)
Immature Granulocytes: 2 %
Lymphocytes Relative: 15 %
Lymphs Abs: 1 10*3/uL (ref 0.7–4.0)
MCH: 31.1 pg (ref 26.0–34.0)
MCHC: 32.8 g/dL (ref 30.0–36.0)
MCV: 94.8 fL (ref 80.0–100.0)
Monocytes Absolute: 0.3 10*3/uL (ref 0.1–1.0)
Monocytes Relative: 4 %
Neutro Abs: 5.5 10*3/uL (ref 1.7–7.7)
Neutrophils Relative %: 77 %
Platelets: 333 10*3/uL (ref 150–400)
RBC: 4.41 MIL/uL (ref 3.87–5.11)
RDW: 12.7 % (ref 11.5–15.5)
WBC: 7.1 10*3/uL (ref 4.0–10.5)
nRBC: 0 % (ref 0.0–0.2)

## 2019-04-15 LAB — C-REACTIVE PROTEIN: CRP: <0.8 mg/dL (ref <1.0)

## 2019-04-15 LAB — COMPREHENSIVE METABOLIC PANEL
ALT: 55 U/L — ABNORMAL HIGH (ref 0–44)
AST: 22 U/L (ref 15–41)
Albumin: 2.8 g/dL — ABNORMAL LOW (ref 3.5–5.0)
Alkaline Phosphatase: 66 U/L (ref 38–126)
Anion gap: 6 (ref 5–15)
BUN: 16 mg/dL (ref 6–20)
CO2: 26 mmol/L (ref 22–32)
Calcium: 8.2 mg/dL — ABNORMAL LOW (ref 8.9–10.3)
Chloride: 112 mmol/L — ABNORMAL HIGH (ref 98–111)
Creatinine, Ser: 0.68 mg/dL (ref 0.44–1.00)
GFR calc Af Amer: >60 mL/min (ref >60)
GFR calc non Af Amer: >60 mL/min (ref >60)
Glucose, Bld: 94 mg/dL (ref 70–99)
Potassium: 3.3 mmol/L — ABNORMAL LOW (ref 3.5–5.1)
Sodium: 144 mmol/L (ref 135–145)
Total Bilirubin: 0.2 mg/dL — ABNORMAL LOW (ref 0.3–1.2)
Total Protein: 5.6 g/dL — ABNORMAL LOW (ref 6.5–8.1)

## 2019-04-15 LAB — BRAIN NATRIURETIC PEPTIDE: B Natriuretic Peptide: 16.5 pg/mL (ref 0.0–100.0)

## 2019-04-15 LAB — MAGNESIUM: Magnesium: 2.4 mg/dL (ref 1.7–2.4)

## 2019-04-15 LAB — FERRITIN: Ferritin: 310 ng/mL — ABNORMAL HIGH (ref 11–307)

## 2019-04-15 LAB — D-DIMER, QUANTITATIVE: D-Dimer, Quant: 0.83 ug/mL-FEU — ABNORMAL HIGH (ref 0.00–0.50)

## 2019-04-15 LAB — GLUCOSE, CAPILLARY
Glucose-Capillary: 86 mg/dL (ref 70–99)
Glucose-Capillary: 141 mg/dL — ABNORMAL HIGH (ref 70–99)
Glucose-Capillary: 131 mg/dL — ABNORMAL HIGH (ref 70–99)

## 2019-04-15 LAB — LACTATE DEHYDROGENASE: LDH: 392 U/L — ABNORMAL HIGH (ref 98–192)

## 2019-04-15 MED ORDER — POTASSIUM CHLORIDE 20 MEQ PO PACK
40.0000 meq | PACK | Freq: Once | ORAL | Status: AC
  Administered 2019-04-15: 40 meq via ORAL
  Filled 2019-04-15: qty 2

## 2019-04-15 MED ORDER — NITROGLYCERIN 2 % TD OINT
0.5000 [in_us] | TOPICAL_OINTMENT | Freq: Once | TRANSDERMAL | Status: AC
  Administered 2019-04-15: 10:00:00 0.5 [in_us] via TOPICAL
  Filled 2019-04-15: qty 30

## 2019-04-15 MED ORDER — FUROSEMIDE 10 MG/ML IJ SOLN
60.0000 mg | Freq: Once | INTRAMUSCULAR | Status: AC
  Administered 2019-04-15: 60 mg via INTRAVENOUS
  Filled 2019-04-15: qty 6

## 2019-04-15 MED ORDER — POTASSIUM CHLORIDE CRYS ER 20 MEQ PO TBCR
40.0000 meq | EXTENDED_RELEASE_TABLET | Freq: Four times a day (QID) | ORAL | Status: AC
  Administered 2019-04-15 (×2): 40 meq via ORAL
  Filled 2019-04-15 (×2): qty 2

## 2019-04-15 NOTE — Evaluation (Signed)
Occupational Therapy Evaluation Patient Details Name: Joyce Mcguire MRN: [id number] DOB: 05-30-1966 Today's Date: 04/15/2019    History of Present Illness 53 y.o.F with no significant PMHx who presented with 5 days cough, progressive SOB.  In the ER, SpO2 <90%, required 4L [place]. SARS-CoV-2 NAA positive. Transferred to [facility], intubated 04/08/19 due to tachypnea, fatigue, escalating O2 requirements. She was givenRemdesivir, Actemra and convalescent plasma with improvement. Extubated 04/10/19   Clinical Impression   PTA, pt was living with her husband and two daughter and was independent and working full time. Pt currently requiring Min A for UB ADLs, Mod-Max A for LB, and Min Guard A for functional mobility with RW. Pt currently presenting with poor activity tolerance as seen by significant fatigue, poor SpO2, and SOB. Upon arrival, pt in bathroom on RA with RN; SpO2 dropping to 50-60%. Placed on 6L O2 via HFNC and pt requiring significant time to elevate SpO2 to 70s. Returned to supine for supported rest and elevated O2 to 8L and then 10L; pt able to elevate SpO2 to 88% with increased time and purse lip breathing. Pt would benefit from further acute OT to facilitate safe dc. Recommend dc to home with HHOT for further OT to optimize safety, independence with ADLs, and return to PLOF.      Follow Up Recommendations  Home health OT;Supervision/Assistance - 24 hour    Equipment Recommendations  3 in 1 bedside commode    Recommendations for Other Services PT consult     Precautions / Restrictions Precautions Precautions: Fall      Mobility Bed Mobility Overal bed mobility: Modified Independent             General bed mobility comments: Increased time  Transfers Overall transfer level: Needs assistance Equipment used: Rolling walker (2 wheeled) Transfers: Sit to/from Stand Sit to Stand: Min guard         General transfer comment: Min Guard A for safety    Balance Overall balance  assessment: Needs assistance Sitting-balance support: No upper extremity supported;Feet supported Sitting balance-Leahy Scale: Fair     Standing balance support: No upper extremity supported;During functional activity Standing balance-Leahy Scale: Poor                             ADL either performed or assessed with clinical judgement   ADL Overall ADL's : Needs assistance/impaired Eating/Feeding: Set up;Bed level   Grooming: Wash/dry hands;Set up;Bed level Grooming Details (indicate cue type and reason): Providing sanitizer for hand hygiene at bed level Upper Body Bathing: Minimal assistance;Sitting   Lower Body Bathing: Moderate assistance;Sit to/from stand   Upper Body Dressing : Minimal assistance;Sitting   Lower Body Dressing: Maximal assistance;Sit to/from stand   Toilet Transfer: Min guard;Ambulation;Regular Toilet;RW   Toileting- Clothing Manipulation and Hygiene: Supervision/safety;Sit to/from stand       Functional mobility during ADLs: Min guard;Rolling walker General ADL Comments: Upon arrival, pt returning to bathroom with RN and on RA. Pt with significant SOB. Placed back on 5L and changed her to a HFNC. Pt Spo2 in 50s after mobility on RA. Pt returning to supine in bed for supported rest and to focus on purse lip breathing. Pt requiring significant amount of time to return to 33s. Elevated O2 to 8L and then 10L and pt slowly returning to 88%. Pt also destats with conversation. Good waveform throughout     Vision         Perception  Praxis      Pertinent Vitals/Pain Pain Assessment: No/denies pain     Hand Dominance     Extremity/Trunk Assessment Upper Extremity Assessment Upper Extremity Assessment: Generalized weakness   Lower Extremity Assessment Lower Extremity Assessment: Defer to PT evaluation   Cervical / Trunk Assessment Cervical / Trunk Assessment: Normal   Communication Communication Communication: No difficulties    Cognition Arousal/Alertness: Awake/alert Behavior During Therapy: WFL for tasks assessed/performed Overall Cognitive Status: Within Functional Limits for tasks assessed                                     General Comments  Discussed O2 needs and purse lip breathing.    Exercises     Shoulder Instructions      Home Living Family/patient expects to be discharged to:: Private residence Living Arrangements: Spouse/significant other;Children(23 and 55 yo daughters) Available Help at Discharge: Family Type of Home: Mobile home Home Access: Stairs to enter Secretary/administratorntrance Stairs-Number of Steps: 3 Entrance Stairs-Rails: Right Home Layout: One level               Home Equipment: None          Prior Functioning/Environment Level of Independence: Independent        Comments: works at [employer] "a lot of walking"        OT Problem List: Decreased strength;Decreased range of motion;Decreased activity tolerance;Impaired balance (sitting and/or standing);Decreased knowledge of use of DME or AE;Decreased knowledge of precautions      OT Treatment/Interventions: Self-care/ADL training;Therapeutic exercise;Energy conservation;DME and/or AE instruction;Therapeutic activities;Patient/family education    OT Goals(Current goals can be found in the care plan section) Acute Rehab OT Goals Patient Stated Goal: get well and go home soon OT Goal Formulation: With patient Time For Goal Achievement: 04/29/19 Potential to Achieve Goals: Good  OT Frequency: Min 3X/week   Barriers to D/C:            Co-evaluation              AM-PAC OT "6 Clicks" Daily Activity     Outcome Measure Help from another person eating meals?: None Help from another person taking care of personal grooming?: A Little Help from another person toileting, which includes using toliet, bedpan, or urinal?: A Little Help from another person bathing (including washing, rinsing, drying)?: A  Lot Help from another person to put on and taking off regular upper body clothing?: A Little Help from another person to put on and taking off regular lower body clothing?: A Lot 6 Click Score: 17   End of Session Equipment Utilized During Treatment: Rolling walker;Oxygen(RA>5L>8L>10L) Nurse Communication: Mobility status(Left her on 10L O2 for recover at bed level)  Activity Tolerance: Patient limited by fatigue Patient left: in bed;with call bell/phone within reach  OT Visit Diagnosis: Unsteadiness on feet (R26.81);Other abnormalities of gait and mobility (R26.89);Muscle weakness (generalized) (M62.81)                Time: [id number] OT Time Calculation (min): 28 min Charges:  OT General Charges $OT Visit: 1 Visit OT Evaluation $OT Eval Moderate Complexity: 1 Mod OT Treatments $Therapeutic Activity: 8-22 mins  Madelynne Lasker MSOT, OTR/L Acute Rehab Pager: [phone] Office: [phone]  Theodoro GristCharis M Biannca Scantlin 04/15/2019, 10:18 AM

## 2019-04-15 NOTE — Progress Notes (Addendum)
PROGRESS NOTE                                                                                                                                                                                                             Patient Demographics:    Joyce Mcguire, is a 53 y.o. female, DOB - 08/10/1966, JJK:[id number]  Outpatient Primary MD for the patient is [facility], Pcp Not In    LOS - 8  Admit date - 04/07/2019    Chief Complaint  Patient presents with  . Shortness of Breath       Brief Narrative  Joyce Mcguire is a 53 y.o. F with no significant PMHx who presented with 5 days cough, progressive SOB.  In the ER, SpO2 < 90%, required 4L [place].  CXR showed bilateral opacities, SARS-CoV-2 NAA positive.  Transferred to [facility], intubated on [facility] day 2 due to tachypnea, fatigue, escalating O2 requirements. She was given Remdesivir, Actemra and convalescent plasma with improvement.   Subjective:   Patient in bed, appears comfortable, denies any headache, no fever, no chest pain or pressure, +ve shortness of breath , no abdominal pain. No focal weakness.   Assessment  & Plan :     1. Acute Hypoxic Resp. Failure due to Acute Covid 19 Viral Illness during the ongoing 2020 Covid 19 Pandemic - requiring Intubation and Vent support, received Remdesivir, Actemra, Steroids & Plasma, much improved, extubated & on [place] 02, advance activity, Pulm toiletry, IS + Flutter valve, monitor.  ABG     Component Value Date/Time   PHART 7.356 04/08/2019 1830   PCO2ART 44.1 04/08/2019 1830   PO2ART 211.0 (H) 04/08/2019 1830   HCO3 24.7 04/08/2019 1830   TCO2 26 04/08/2019 1830   ACIDBASEDEF 1.0 04/08/2019 1830   O2SAT 100.0 04/08/2019 1830    COVID-19 Labs  Recent Labs    04/13/19 0544 04/15/19 0341  DDIMER 0.95* 0.83*  FERRITIN 627* 310*  LDH  --  392*  CRP <0.8 <0.8    Lab Results  Component Value Date   SARSCOV2NAA POSITIVE (A)  04/07/2019     Hepatic Function Latest Ref Rng & Units 04/15/2019 04/13/2019 04/12/2019  Total Protein 6.5 - 8.1 g/dL 5.6(L) 6.0(L) 6.8  Albumin 3.5 - 5.0 g/dL 2.8(L) 2.9(L) 3.2(L)  AST 15 - 41 U/L 22 59(H) 39  ALT 0 -  44 U/L 55(H) 121(H) 38  Alk Phosphatase 38 - 126 U/L 66 68 69  Total Bilirubin 0.3 - 1.2 mg/dL 0.2(L) 0.7 0.9        Component Value Date/Time   BNP 16.5 04/15/2019 0341      2.  Transaminitis -  Due to combination of Covid + Actemra, no pain, follow trend.  3.  Toxic Encephalopathy - resolved.  4. UTI - pan sensitive Ecoli, on Rocephin,   5.  Acute on chronic nonspecific CHF.  No previous echo EF on file.  Diurese with IV Lasix, Nitropaste and blood pressure control.  Monitor.  Needs outpatient echocardiogram and cardiology follow-up.  Will be placed on fluid restriction.    6.  Hypokalemia.  Replaced.    7.  ? cavitary lung lesion on chest x-ray.  Will get a CT chest.    8. 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   Lab Results  Component Value Date   HGBA1C 6.3 (H) 04/12/2019    CBG (last 3)  Recent Labs    04/14/19 1824 04/14/19 2105 04/15/19 0812  GLUCAP 152* 203* 86    Condition - Fair  Family Communication  :  None  Code Status :  Full  Diet :   Diet Order            Diet Carb Modified Fluid consistency: Thin; Room service appropriate? Yes  Diet effective now               Disposition Plan  :  Home 2-3 days  Consults  :  PCCM  Procedures  :  None  PUD Prophylaxis : None  DVT Prophylaxis  :  Lovenox    Lab Results  Component Value Date   PLT 333 04/15/2019    Inpatient Medications  Scheduled Meds: . chlorhexidine gluconate (MEDLINE KIT)  15 mL Mouth Rinse BID  . Chlorhexidine Gluconate Cloth  6 each Topical Q0600  . enoxaparin (LOVENOX) injection  40 mg Subcutaneous Q12H  . feeding supplement (ENSURE ENLIVE)  237 mL Oral TID BM  . insulin aspart  0-15 Units Subcutaneous TID WC  . insulin  glargine  12 Units Subcutaneous Daily  . OLANZapine  2.5 mg Oral QHS  . potassium chloride  40 mEq Oral Q6H  . vitamin C  500 mg Oral Daily  . zinc sulfate  220 mg Oral Daily   Continuous Infusions: . sodium chloride Stopped (04/14/19 1850)   PRN Meds:.sodium chloride, acetaminophen, albuterol, ALPRAZolam, guaiFENesin-dextromethorphan, HYDROcodone-acetaminophen, ondansetron **OR** ondansetron (ZOFRAN) IV, polyethylene glycol  Antibiotics  :    Anti-infectives (From admission, onward)   Start     Dose/Rate Route Frequency Ordered Stop   04/13/19 1000  cefTRIAXone (ROCEPHIN) 1 g in sodium chloride 0.9 % 100 mL IVPB     1 g 200 mL/hr over 30 Minutes Intravenous Every 24 hours 04/13/19 0758 04/15/19 1017   04/08/19 2345  remdesivir 100 mg in sodium chloride 0.9 % 230 mL IVPB     100 mg 500 mL/hr over 30 Minutes Intravenous Every 24 hours 04/07/19 2330 04/11/19 2340   04/07/19 2345  remdesivir 200 mg in sodium chloride 0.9 % 210 mL IVPB     200 mg 500 mL/hr over 30 Minutes Intravenous Once 04/07/19 2330 04/08/19 0037       Time Spent in minutes  30   Joyce Mcguire M.D on 04/15/2019 at 11:57 AM  To page go  to [email or web address] - password Joyce Mcguire  [facility] -  Office  [phone]  See all Orders from today for further details    Objective:   Vitals:   04/15/19 0424 04/15/19 0427 04/15/19 0500 04/15/19 0700  BP:  100/65    Pulse: 89 80    Resp: (!) 24 (!) 24    Temp: 98.7 F (37.1 C)   98.6 F (37 C)  TempSrc: Axillary     SpO2: 93% 93%    Weight:   66 kg   Height:        Wt Readings from Last 3 Encounters:  04/15/19 66 kg     Intake/Output Summary (Last 24 hours) at 04/15/2019 1157 Last data filed at 04/14/2019 2100 Gross per 24 hour  Intake 494.01 ml  Output -  Net 494.01 ml     Physical Exam  Awake Alert, Oriented X 3, No new F.N deficits, Normal affect [place].AT,PERRAL Supple Neck,No JVD, No cervical lymphadenopathy appriciated.  Symmetrical Chest wall  movement, Good air movement bilaterally, +ve rales RRR,No Gallops, Rubs or new Murmurs, No Parasternal Heave +ve B.Sounds, Abd Soft, No tenderness, No organomegaly appriciated, No rebound - guarding or rigidity. No Cyanosis, Clubbing or edema, No new Rash or bruise     Data Review:    CBC Recent Labs  Lab 04/09/19 0140 04/10/19 0433 04/12/19 0312 04/13/19 0544 04/15/19 0341  WBC 7.3 12.4* 9.7 8.6 7.1  HGB 11.8* 11.8* 13.5 14.1 13.7  HCT 36.9 36.9 42.4 42.8 41.8  PLT 308 346 374 388 333  MCV 95.6 97.1 94.9 95.5 94.8  MCH 30.6 31.1 30.2 31.5 31.1  MCHC 32.0 32.0 31.8 32.9 32.8  RDW 13.0 13.2 12.6 12.9 12.7  LYMPHSABS 0.6* 0.6* 0.9 0.8 1.0  MONOABS 0.2 0.6 0.5 0.3 0.3  EOSABS 0.0 0.0 0.0 0.1 0.2  BASOSABS 0.0 0.0 0.0 0.0 0.0    Chemistries  Recent Labs  Lab 04/08/19 1600  04/09/19 0140 04/10/19 0433 04/12/19 0312 04/13/19 0544 04/14/19 0340 04/14/19 0734 04/15/19 0341  NA  --    < > 141 148* 149* 148*  --  145 144  K  --    < > 3.8 3.7 3.0* 3.3*  --  3.7 3.3*  CL  --    < > 113* 113* 110 112*  --  110 112*  CO2  --    < > _0 --  26 26  GLUCOSE  --    < > 204* 213* 147* 195*  --  105* 94  BUN  --    < > 20 30* 31* 21*  --  17 16  CREATININE  --    < > 0.61 0.66 0.74 0.75 0.71 0.71 0.68  CALCIUM  --    < > 8.2* 8.4* 8.7* 8.6*  --  8.4* 8.2*  MG 2.0  --  2.2  --   --   --   --  2.3 2.4  AST  --   --  38 34 39 59*  --   --  22  ALT  --   --  37 37 38 121*  --   --  55*  ALKPHOS  --   --  63 69 69 68  --   --  66  BILITOT  --   --  0.2* 0.2* 0.9 0.7  --   --  0.2*   < > = values in this interval not displayed.   ------------------------------------------------------------------------------------------------------------------  No results for input(s): CHOL, HDL, LDLCALC, TRIG, CHOLHDL, LDLDIRECT in the last 72 hours.  Lab Results  Component Value Date   HGBA1C 6.3 (H) 04/12/2019    ------------------------------------------------------------------------------------------------------------------ No results for input(s): TSH, T4TOTAL, T3FREE, THYROIDAB in the last 72 hours.  Invalid input(s): FREET3  Cardiac Enzymes No results for input(s): CKMB, TROPONINI, MYOGLOBIN in the last 168 hours.  Invalid input(s): CK ------------------------------------------------------------------------------------------------------------------    Component Value Date/Time   BNP 16.5 04/15/2019 0341    Micro Results Recent Results (from the past 240 hour(s))  SARS Coronavirus 2 (CEPHEID- Performed in [facility]), Hosp Order     Status: Abnormal   Collection Time: 04/07/19  2:53 PM  Result Value Ref Range Status   SARS Coronavirus 2 POSITIVE (A) NEGATIVE Final    Comment: RESULT CALLED TO, READ BACK BY AND VERIFIED WITH: Ethelda Chick [id number] @ [place] (NOTE) If result is NEGATIVE SARS-CoV-2 target nucleic acids are NOT DETECTED. The SARS-CoV-2 RNA is generally detectable in upper and lower  respiratory specimens during the acute phase of infection. The lowest  concentration of SARS-CoV-2 viral copies this assay can detect is 250  copies / mL. A negative result does not preclude SARS-CoV-2 infection  and should not be used as the sole basis for treatment or other  patient management decisions.  A negative result may occur with  improper specimen collection / handling, submission of specimen other  than nasopharyngeal swab, presence of viral mutation(s) within the  areas targeted by this assay, and inadequate number of viral copies  (<250 copies / mL). A negative result must be combined with clinical  observations, patient history, and epidemiological information. If result is POSITIVE SARS-CoV-2 target nucleic acids are DETEC TED. The SARS-CoV-2 RNA is generally detectable in upper and lower  respiratory specimens during the acute phase of infection.   Positive  results are indicative of active infection with SARS-CoV-2.  Clinical  correlation with patient history and other diagnostic information is  necessary to determine patient infection status.  Positive results do  not rule out bacterial infection or co-infection with other viruses. If result is PRESUMPTIVE POSTIVE SARS-CoV-2 nucleic acids MAY BE PRESENT.   A presumptive positive result was obtained on the submitted specimen  and confirmed on repeat testing.  While 2019 novel coronavirus  (SARS-CoV-2) nucleic acids may be present in the submitted sample  additional confirmatory testing may be necessary for epidemiological  and / or clinical management purposes  to differentiate between  SARS-CoV-2 and other Sarbecovirus currently known to infect humans.  If clinically indicated additional testing with an alternate test  methodology (LAB7 [id number]) is advised. The SARS-CoV-2 RNA is generally  detectable in upper and lower respiratory specimens during the acute  phase of infection. The expected result is Negative. Fact Sheet for Patients:  StrictlyIdeas.no Fact Sheet for Healthcare Providers: BankingDealers.co.za This test is not yet approved or cleared by the [place] FDA and has been authorized for detection and/or diagnosis of SARS-CoV-2 by FDA under an Emergency Use Authorization (EUA).  This EUA will remain in effect (meaning this test can be used) for the duration of the COVID-19 declaration under Section 564(b)(1) of the Act, 21 U.S.C. section 360bbb-3(b)(1), unless the authorization is terminated or revoked sooner. Performed at [facility], [place] [address] [place], [place] [address]   Blood culture (routine x 2)     Status: None   Collection Time: 04/07/19  4:21 PM  Result Value  Ref Range Status   Specimen Description   Final    BLOOD RIGHT HAND Performed at [facility]  [address], [place], [place] [address]    Special Requests   Final    BOTTLES DRAWN AEROBIC ONLY Blood Culture adequate volume Performed at [facility] [address] [place], [place] [address]    Culture   Final    NO GROWTH 5 DAYS Performed at [facility], [place] [address], [place], [place] [address]    Report Status 04/12/2019 FINAL  Final  Blood culture (routine x 2)     Status: None   Collection Time: 04/07/19  4:26 PM  Result Value Ref Range Status   Specimen Description   Final    BLOOD RIGHT HAND Performed at [facility] [address] [place], [place] [address]    Special Requests   Final    BOTTLES DRAWN AEROBIC ONLY Blood Culture adequate volume Performed at [facility] [address], [place], [place] [address]    Culture   Final    NO GROWTH 5 DAYS Performed at [facility], [place] [address], [place], [place] [address]    Report Status 04/12/2019 FINAL  Final  MRSA PCR Screening     Status: None   Collection Time: 04/07/19 11:58 PM  Result Value Ref Range Status   MRSA by PCR NEGATIVE NEGATIVE Final    Comment:        The GeneXpert MRSA Assay (FDA approved for NASAL specimens only), is one component of a comprehensive MRSA colonization surveillance program. It is not intended to diagnose MRSA infection nor to guide or monitor treatment for MRSA infections. Performed at [facility], [place] [address], [place], [place] [address]   Urine culture     Status: Abnormal   Collection Time: 04/08/19  8:30 AM  Result Value Ref Range Status   Specimen Description   Final    URINE, RANDOM Performed at [facility] [address] [place], [place] [address]    Special Requests   Final    NONE Performed at [facility], [place] [address] [place],  [address]    Culture >=100,000 COLONIES/mL ESCHERICHIA COLI (A)  Final   Report Status  04/10/2019 FINAL  Final   Organism ID, Bacteria ESCHERICHIA COLI (A)  Final      Susceptibility   Escherichia coli - MIC*    AMPICILLIN 4 SENSITIVE Sensitive     CEFAZOLIN <=4 SENSITIVE Sensitive     CEFTRIAXONE <=1 SENSITIVE Sensitive     CIPROFLOXACIN <=0.25 SENSITIVE Sensitive     GENTAMICIN <=1 SENSITIVE Sensitive     IMIPENEM <=0.25 SENSITIVE Sensitive     NITROFURANTOIN <=16 SENSITIVE Sensitive     TRIMETH/SULFA <=20 SENSITIVE Sensitive     AMPICILLIN/SULBACTAM <=2 SENSITIVE Sensitive     PIP/TAZO <=4 SENSITIVE Sensitive     Extended ESBL NEGATIVE Sensitive     * >=100,000 COLONIES/mL ESCHERICHIA COLI  Respiratory Panel by PCR     Status: None   Collection Time: 04/08/19  8:45 AM  Result Value Ref Range Status   Adenovirus NOT DETECTED NOT DETECTED Final   Coronavirus 229E NOT DETECTED NOT DETECTED Final    Comment: (NOTE) The Coronavirus on the Respiratory Panel, DOES NOT test for the novel  Coronavirus (2019 nCoV)    Coronavirus HKU1 NOT DETECTED NOT DETECTED Final   Coronavirus NL63 NOT  DETECTED NOT DETECTED Final   Coronavirus OC43 NOT DETECTED NOT DETECTED Final   Metapneumovirus NOT DETECTED NOT DETECTED Final   Rhinovirus / Enterovirus NOT DETECTED NOT DETECTED Final   Influenza A NOT DETECTED NOT DETECTED Final   Influenza B NOT DETECTED NOT DETECTED Final   Parainfluenza Virus 1 NOT DETECTED NOT DETECTED Final   Parainfluenza Virus 2 NOT DETECTED NOT DETECTED Final   Parainfluenza Virus 3 NOT DETECTED NOT DETECTED Final   Parainfluenza Virus 4 NOT DETECTED NOT DETECTED Final   Respiratory Syncytial Virus NOT DETECTED NOT DETECTED Final   Bordetella pertussis NOT DETECTED NOT DETECTED Final   Chlamydophila pneumoniae NOT DETECTED NOT DETECTED Final   Mycoplasma pneumoniae NOT DETECTED NOT DETECTED Final    Comment: Performed at [facility], [place] [address] [place], [place] [address]    Radiology Reports Dg Abd 1 View  Result Date: 04/08/2019 CLINICAL  DATA:  OG tube placement. EXAM: ABDOMEN - 1 VIEW COMPARISON:  None. FINDINGS: OG tube is in place with the tip in the distal stomach or first portion of the duodenum. IMPRESSION: As above. Electronically Signed   By: Inge Rise M.D.   On: 04/08/2019 15:05   Dg Chest Port 1 View  Result Date: 04/15/2019 CLINICAL DATA:  Shortness of breath, COVID-19 EXAM: PORTABLE CHEST 1 VIEW COMPARISON:  Portable exam 0940 hours compared to 04/08/2019 FINDINGS: Normal heart size, mediastinal contours, and pulmonary vascularity. Patchy BILATERAL pulmonary infiltrates consistent with pneumonia. Area of relative lucency in the RIGHT mid lung may represent spared parenchyma though attention on follow-up imaging recommended to exclude developing cavitation. No pleural effusion or pneumothorax. Bones unremarkable. IMPRESSION: Patchy BILATERAL pulmonary infiltrates consistent with pneumonia. Lucency in the RIGHT mid lung, likely spared lung, but recommend attention on follow-up exams to exclude developing cavitation. Electronically Signed   By: Lavonia Dana M.D.   On: 04/15/2019 10:02   Dg Chest Port 1 View  Result Date: 04/08/2019 CLINICAL DATA:  Multifocal pneumonia.  COVID Positive. EXAM: PORTABLE CHEST 1 VIEW COMPARISON:  04/07/2019 FINDINGS: Endotracheal tube tip is 11 mm above the carina. NG tube tip is in the distal stomach or duodenal bulb. The patient has extensive bilateral hazy pulmonary infiltrates which appear less prominent than on the prior study but the aeration is improved in both lungs as compared to the prior exam. Heart size and vascularity are normal. No effusions. Bones are normal. IMPRESSION: 1. Extensive bilateral pulmonary infiltrates consistent with the patient's history of COVID-19. 2. Endotracheal tube is 11 mm above the carina and could be slightly retracted. Electronically Signed   By: Lorriane Shire M.D.   On: 04/08/2019 15:06   Dg Chest Port 1 View  Result Date: 04/07/2019 CLINICAL DATA:   Shortness of breath since last Friday, cough, chest pain, history asthma, reported COVID-19 positive test at another [facility] EXAM: PORTABLE CHEST 1 VIEW COMPARISON:  Portable exam 1455 hours without priors for comparison FINDINGS: Upper normal heart size. Normal mediastinal contours. Patchy BILATERAL pulmonary infiltrates likely infectious. No definite pleural effusion or pneumothorax. Osseous structures unremarkable. IMPRESSION: Patchy BILATERAL pulmonary infiltrates consistent with pneumonia, including atypical etiologies. Electronically Signed   By: Lavonia Dana M.D.   On: 04/07/2019 15:34

## 2019-04-15 NOTE — Progress Notes (Signed)
[facility] Department and AHF Team  ReDS Research Project   Patient #: [id number]  ReDS Measurement  Right: low quality x2   Left: 49%

## 2019-04-15 NOTE — Progress Notes (Signed)
Alert and oriented x  4. Vital signs remain stable (see flow sheet). Out of bed to recliner. Ambulates in room with rolling walker.Dyspnea on exertion noted. On 10 liters nasal canula with O2 SAT 92-94%. No complaints of pain and/or discomfort. Call bell within reach. Encouraged to report signs and symptoms to staff.

## 2019-04-16 ENCOUNTER — Encounter (HOSPITAL_COMMUNITY): Payer: Self-pay

## 2019-04-16 LAB — CBC WITH DIFFERENTIAL/PLATELET
Abs Immature Granulocytes: 0.09 10*3/uL — ABNORMAL HIGH (ref 0.00–0.07)
Basophils Absolute: 0 10*3/uL (ref 0.0–0.1)
Basophils Relative: 0 %
Eosinophils Absolute: 0.2 10*3/uL (ref 0.0–0.5)
Eosinophils Relative: 2 %
HCT: 45.3 % (ref 36.0–46.0)
Hemoglobin: 14.5 g/dL (ref 12.0–15.0)
Immature Granulocytes: 1 %
Lymphocytes Relative: 11 %
Lymphs Abs: 0.9 10*3/uL (ref 0.7–4.0)
MCH: 30.5 pg (ref 26.0–34.0)
MCHC: 32 g/dL (ref 30.0–36.0)
MCV: 95.4 fL (ref 80.0–100.0)
Monocytes Absolute: 0.4 10*3/uL (ref 0.1–1.0)
Monocytes Relative: 5 %
Neutro Abs: 6.4 10*3/uL (ref 1.7–7.7)
Neutrophils Relative %: 81 %
Platelets: 287 10*3/uL (ref 150–400)
RBC: 4.75 MIL/uL (ref 3.87–5.11)
RDW: 13 % (ref 11.5–15.5)
WBC: 8.1 10*3/uL (ref 4.0–10.5)
nRBC: 0 % (ref 0.0–0.2)

## 2019-04-16 LAB — C-REACTIVE PROTEIN: CRP: 0.9 mg/dL (ref <1.0)

## 2019-04-16 LAB — COMPREHENSIVE METABOLIC PANEL
ALT: 44 U/L (ref 0–44)
AST: 23 U/L (ref 15–41)
Albumin: 3.1 g/dL — ABNORMAL LOW (ref 3.5–5.0)
Alkaline Phosphatase: 70 U/L (ref 38–126)
Anion gap: 9 (ref 5–15)
BUN: 19 mg/dL (ref 6–20)
CO2: 22 mmol/L (ref 22–32)
Calcium: 8.7 mg/dL — ABNORMAL LOW (ref 8.9–10.3)
Chloride: 113 mmol/L — ABNORMAL HIGH (ref 98–111)
Creatinine, Ser: 0.75 mg/dL (ref 0.44–1.00)
GFR calc Af Amer: >60 mL/min (ref >60)
GFR calc non Af Amer: >60 mL/min (ref >60)
Glucose, Bld: 99 mg/dL (ref 70–99)
Potassium: 4.4 mmol/L (ref 3.5–5.1)
Sodium: 144 mmol/L (ref 135–145)
Total Bilirubin: 0.4 mg/dL (ref 0.3–1.2)
Total Protein: 6 g/dL — ABNORMAL LOW (ref 6.5–8.1)

## 2019-04-16 LAB — LACTATE DEHYDROGENASE: LDH: 427 U/L — ABNORMAL HIGH (ref 98–192)

## 2019-04-16 LAB — GLUCOSE, CAPILLARY
Glucose-Capillary: 118 mg/dL — ABNORMAL HIGH (ref 70–99)
Glucose-Capillary: 173 mg/dL — ABNORMAL HIGH (ref 70–99)
Glucose-Capillary: 167 mg/dL — ABNORMAL HIGH (ref 70–99)
Glucose-Capillary: 152 mg/dL — ABNORMAL HIGH (ref 70–99)
Glucose-Capillary: 121 mg/dL — ABNORMAL HIGH (ref 70–99)

## 2019-04-16 LAB — MAGNESIUM: Magnesium: 2.4 mg/dL (ref 1.7–2.4)

## 2019-04-16 LAB — D-DIMER, QUANTITATIVE: D-Dimer, Quant: 0.78 ug/mL-FEU — ABNORMAL HIGH (ref 0.00–0.50)

## 2019-04-16 LAB — BRAIN NATRIURETIC PEPTIDE: B Natriuretic Peptide: 16.3 pg/mL (ref 0.0–100.0)

## 2019-04-16 LAB — FERRITIN: Ferritin: 321 ng/mL — ABNORMAL HIGH (ref 11–307)

## 2019-04-16 MED ORDER — FUROSEMIDE 10 MG/ML IJ SOLN
60.0000 mg | Freq: Once | INTRAMUSCULAR | Status: AC
  Administered 2019-04-16: 60 mg via INTRAVENOUS
  Filled 2019-04-16: qty 6

## 2019-04-16 MED ORDER — ISOSORBIDE MONONITRATE ER 30 MG PO TB24
30.0000 mg | ORAL_TABLET | Freq: Every day | ORAL | Status: DC
  Administered 2019-04-16 – 2019-04-20 (×5): 30 mg via ORAL
  Filled 2019-04-16 (×7): qty 1

## 2019-04-16 NOTE — Progress Notes (Signed)
[facility] Department and AHF Team  ReDS Research Project   Patient #: [id number]  ReDS Measurement  Right: 47%  Left: 45%

## 2019-04-16 NOTE — Progress Notes (Signed)
   04/16/19 2300  Oxygen Therapy  SpO2 (!) 85 % (patient had [place] off)  Patient asleep with [place] off, oxygen reapplied and sats increased to 95%

## 2019-04-16 NOTE — Progress Notes (Signed)
Spoke with patient's daughter, Laurena Spies and gave her updates on patient's condition. She indicated that her mother's cell phone has been lost and she would like [place] to look for it and let her know if it is found. I answered all of her questions regarding the medications her mother has received on my shift. She was satisfied with all answers given.  I told her to feel free to call if she has further questions regarding patient.

## 2019-04-16 NOTE — Progress Notes (Signed)
Occupational Therapy Treatment Patient Details Name: Joyce Mcguire MRN: [id number] DOB: January 11, 1966 Today's Date: 04/16/2019    History of present illness 53 y.o.F with no significant PMHx who presented with 5 days cough, progressive SOB.  In the ER, SpO2 <90%, required 4L [place]. SARS-CoV-2 NAA positive. Transferred to [place], intubated 04/08/19 due to tachypnea, fatigue, escalating O2 requirements. She was givenRemdesivir, Actemra and convalescent plasma with improvement. Extubated 04/10/19   OT comments  Pt progressing towards established OT goals. Continues to present with decreased activity tolerance as seen by significant fatigue and elevated HR and RR. Pt performing functional mobility to the bathroom to complete grooming at sink. Pt requiring Min Guard A for safety during standing ADLs and mobility with RW. SpO2 >90% on 8L. HR and RR elevating with activity. Pt requiring seated rest break during grooming at sink due to significant fatigue. Continue to recommend dc home with HHOT and will continue to follow acutely as admitted.   Vitals at rest: SpO2 96% on 7L, RR 15, and HR 110.  Vitals during activity: SpO2 94% on 8L, RR 42, and HR 133.   Follow Up Recommendations  Home health OT;Supervision/Assistance - 24 hour    Equipment Recommendations  3 in 1 bedside commode    Recommendations for Other Services PT consult    Precautions / Restrictions Precautions Precautions: Fall       Mobility Bed Mobility               General bed mobility comments: In recliner upon arrival  Transfers Overall transfer level: Needs assistance Equipment used: Rolling walker (2 wheeled) Transfers: Sit to/from Stand Sit to Stand: Min guard         General transfer comment: Min Guard A for safety    Balance Overall balance assessment: Needs assistance Sitting-balance support: No upper extremity supported;Feet supported Sitting balance-Leahy Scale: Fair     Standing balance support: No  upper extremity supported;During functional activity Standing balance-Leahy Scale: Poor                             ADL either performed or assessed with clinical judgement   ADL Overall ADL's : Needs assistance/impaired     Grooming: Min guard;Sitting;Standing;Wash/dry face;Oral care;Brushing hair;Minimal assistance Grooming Details (indicate cue type and reason): Pt performing oral care at sink with Min Guard A for safety. Pt fatigues quickly and required seated rest break. While seated, pt brushing her hair; required Min A to assist with back of hair due to fatigue and increased tangles. Pt then standing to wash her face. SpO2 >90% on 8L via [facility]. HR 130s. RR 40-20s.                  Toilet Transfer: Min guard;Ambulation;RW;BSC           Functional mobility during ADLs: Min guard;Rolling walker General ADL Comments: Pt performing functional mobility ot bathroom to complete grooming tasks. Fatigues quickly     Vision       Perception     Praxis      Cognition Arousal/Alertness: Awake/alert Behavior During Therapy: WFL for tasks assessed/performed Overall Cognitive Status: Within Functional Limits for tasks assessed                                 General Comments: Motivated to return home and to [facility]  Exercises Exercises: Other exercises Other Exercises Other Exercises: Pt reporting she has been performing exercises daily   Shoulder Instructions       General Comments SpO2 >90% on 8L. HR elevating to 137. RR elevating to 42.     Pertinent Vitals/ Pain       Pain Assessment: No/denies pain  Home Living Family/patient expects to be discharged to:: Private residence Living Arrangements: Spouse/significant other;Children(23 and 55 yo daughters) Available Help at Discharge: Family Type of Home: Mobile home Home Access: Stairs to enter Secretary/administratorntrance Stairs-Number of Steps: 3 Entrance Stairs-Rails: Right Home Layout: One level                Home Equipment: None          Prior Functioning/Environment Level of Independence: Independent        Comments: works at [employer] "a lot of walking"   Frequency  Min 3X/week        Progress Toward Goals  OT Goals(current goals can now be found in the care plan section)  Progress towards OT goals: Progressing toward goals  Acute Rehab OT Goals Patient Stated Goal: get well and go home soon OT Goal Formulation: With patient Time For Goal Achievement: 04/29/19 Potential to Achieve Goals: Good ADL Goals Pt Will Perform Grooming: with modified independence;standing Pt Will Perform Lower Body Dressing: with modified independence;sit to/from stand Pt Will Transfer to Toilet: with modified independence;ambulating;regular height toilet Pt Will Perform Toileting - Clothing Manipulation and hygiene: with modified independence;sit to/from stand;sitting/lateral leans Additional ADL Goal #1: Pt will demonstrate increased activity tolerance to perform three ADLs in standing with supervision  Plan Discharge plan remains appropriate    Co-evaluation                 AM-PAC OT "6 Clicks" Daily Activity     Outcome Measure   Help from another person eating meals?: None Help from another person taking care of personal grooming?: A Little Help from another person toileting, which includes using toliet, bedpan, or urinal?: A Little Help from another person bathing (including washing, rinsing, drying)?: A Lot Help from another person to put on and taking off regular upper body clothing?: A Little Help from another person to put on and taking off regular lower body clothing?: A Lot 6 Click Score: 17    End of Session Equipment Utilized During Treatment: Rolling walker;Oxygen(8L)  OT Visit Diagnosis: Unsteadiness on feet (R26.81);Other abnormalities of gait and mobility (R26.89);Muscle weakness (generalized) (M62.81)   Activity Tolerance Patient tolerated  treatment well   Patient Left with call bell/phone within reach;in chair;with chair alarm set   Nurse Communication Mobility status        Time: [id number] OT Time Calculation (min): 28 min  Charges: OT General Charges $OT Visit: 1 Visit OT Treatments $Self Care/Home Management : 23-37 mins  Leler Brion MSOT, OTR/L Acute Rehab Pager: [phone] Office: [phone]   Theodoro GristCharis M Galileah Piggee 04/16/2019, 10:53 AM

## 2019-04-16 NOTE — Progress Notes (Signed)
PROGRESS NOTE                                                                                                                                                                                                             Patient Demographics:    Joyce Mcguire, is a 53 y.o. female, DOB - [birth year], PQZ:[id number]  Outpatient Primary MD for the patient is System, Pcp Not In    LOS - 9  Admit date - 04/07/2019    Chief Complaint  Patient presents with   Shortness of Breath       Brief Narrative  Joyce Mcguire is a 53 y.o. F with no significant PMHx who presented with 5 days cough, progressive SOB.  In the ER, SpO2 < 90%, required 4L [place].  CXR showed bilateral opacities, SARS-CoV-2 NAA positive.  Transferred to [facility], intubated on hospital day 2 due to tachypnea, fatigue, escalating O2 requirements. She was given Remdesivir, Actemra and convalescent plasma with improvement.   Subjective:   Patient in bed, appears comfortable, denies any headache, no fever, no chest pain or pressure, +ve shortness of breath , no abdominal pain. No focal weakness.    Assessment  & Plan :     1. Acute Hypoxic Resp. Failure due to Acute Covid 19 Viral Illness during the ongoing 2020 Covid 19 Pandemic - requiring Intubation and Vent support, received Remdesivir, Actemra, Steroids & Plasma, much improved, extubated & on [place] 02, advance activity, Pulm toiletry, IS + Flutter valve, monitor.  ABG     Component Value Date/Time   PHART 7.356 04/08/2019 1830   PCO2ART 44.1 04/08/2019 1830   PO2ART 211.0 (H) 04/08/2019 1830   HCO3 24.7 04/08/2019 1830   TCO2 26 04/08/2019 1830   ACIDBASEDEF 1.0 04/08/2019 1830   O2SAT 100.0 04/08/2019 1830    COVID-19 Labs  Recent Labs    04/15/19 0341 04/16/19 0358  DDIMER 0.83* 0.78*  FERRITIN 310* 321*  LDH 392* 427*  CRP <0.8 0.9    Lab Results  Component Value Date   SARSCOV2NAA POSITIVE (A)  04/07/2019     Hepatic Function Latest Ref Rng & Units 04/16/2019 04/15/2019  04/13/2019  Total Protein 6.5 - 8.1 g/dL 6.0(L) 5.6(L) 6.0(L)  Albumin 3.5 - 5.0 g/dL 3.1(L) 2.8(L) 2.9(L)  AST 15 - 41 U/L 23 22 59(H)  ALT 0 - 44 U/L 44 55(H) 121(H)  Alk Phosphatase 38 - 126 U/L 70 66 68  Total Bilirubin 0.3 - 1.2 mg/dL 0.4 0.2(L) 0.7        Component Value Date/Time   BNP 16.3 04/16/2019 0358      2.  Transaminitis -  Due to combination of Covid + Actemra, no pain, LFTs are now normal.  3.  Toxic Encephalopathy - resolved.  4. UTI - pan sensitive Ecoli, on Rocephin x 3 days.   5.  Acute on chronic nonspecific CHF.  No previous echo EF on file.  Diurese with IV Lasix, dose repeated 04/16/2019, Nitropaste and blood pressure control.  Monitor.  Needs outpatient echocardiogram and cardiology follow-up.  Will be placed on fluid restriction.    6.  Hypokalemia.  Replaced and stable.  7.  ? cavitary lung lesion on chest x-ray.  Stable CT no cavitary lesion.  8. 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   Lab Results  Component Value Date   HGBA1C 6.3 (H) 04/12/2019    CBG (last 3)  Recent Labs    04/15/19 1220 04/15/19 2135 04/16/19 1040  GLUCAP 141* 131* 173*    Condition - Fair  Family Communication  :  None  Code Status :  Full  Diet :   Diet Order            Diet Carb Modified Fluid consistency: Thin; Room service appropriate? Yes  Diet effective now               Disposition Plan  :  Home once better  Consults  :  PCCM  Procedures  :  None  PUD Prophylaxis : None  DVT Prophylaxis  :  Lovenox    Lab Results  Component Value Date   PLT 287 04/16/2019    Inpatient Medications  Scheduled Meds:  chlorhexidine gluconate (MEDLINE KIT)  15 mL Mouth Rinse BID   Chlorhexidine Gluconate Cloth  6 each Topical Q0600   enoxaparin (LOVENOX) injection  40 mg Subcutaneous Q12H   feeding supplement (ENSURE ENLIVE)  237 mL Oral TID  BM   insulin aspart  0-15 Units Subcutaneous TID WC   insulin glargine  12 Units Subcutaneous Daily   OLANZapine  2.5 mg Oral QHS   vitamin C  500 mg Oral Daily   zinc sulfate  220 mg Oral Daily   Continuous Infusions:  sodium chloride Stopped (04/14/19 1850)   PRN Meds:.sodium chloride, acetaminophen, albuterol, ALPRAZolam, guaiFENesin-dextromethorphan, HYDROcodone-acetaminophen, ondansetron **OR** ondansetron (ZOFRAN) IV, polyethylene glycol  Antibiotics  :    Anti-infectives (From admission, onward)   Start     Dose/Rate Route Frequency Ordered Stop   04/13/19 1000  cefTRIAXone (ROCEPHIN) 1 g in sodium chloride 0.9 % 100 mL IVPB     1 g 200 mL/hr over 30 Minutes Intravenous Every 24 hours 04/13/19 0758 04/15/19 1015   04/08/19 2345  remdesivir 100 mg in sodium chloride 0.9 % 230 mL IVPB     100 mg 500 mL/hr over 30 Minutes Intravenous Every 24 hours 04/07/19 2330 04/11/19 2340   04/07/19 2345  remdesivir 200 mg in sodium chloride 0.9 % 210 mL IVPB     200 mg 500 mL/hr over 30 Minutes Intravenous Once  04/07/19 2330 04/08/19 0037       Time Spent in minutes  30   Lala Lund M.D on 04/16/2019 at 10:55 AM  To page go to [email or web address] - password [facility]  [facility] -  Office  [phone]  See all Orders from today for further details    Objective:   Vitals:   04/15/19 1500 04/15/19 2100 04/15/19 2356 04/16/19 0416  BP:  113/78 111/78 107/76  Pulse:   (!) 111 95  Resp:  20  (!) 31  Temp: 98.6 F (37 C) 98.4 F (36.9 C) 98.1 F (36.7 C) 98.6 F (37 C)  TempSrc:  Oral Oral Oral  SpO2:  97% 90% 96%  Weight:      Height:        Wt Readings from Last 3 Encounters:  04/15/19 66 kg     Intake/Output Summary (Last 24 hours) at 04/16/2019 1055 Last data filed at 04/16/2019 1003 Gross per 24 hour  Intake 120 ml  Output 675 ml  Net -555 ml     Physical Exam  Awake Alert, Oriented X 3, No new F.N deficits, Normal affect [place].AT,PERRAL Supple  Neck,No JVD, No cervical lymphadenopathy appriciated.  Symmetrical Chest wall movement, Good air movement bilaterally, +ve rales RRR,No Gallops, Rubs or new Murmurs, No Parasternal Heave +ve B.Sounds, Abd Soft, No tenderness, No organomegaly appriciated, No rebound - guarding or rigidity. No Cyanosis, Clubbing or edema, No new Rash or bruise    Data Review:    CBC Recent Labs  Lab 04/10/19 0433 04/12/19 0312 04/13/19 0544 04/15/19 0341 04/16/19 0358  WBC 12.4* 9.7 8.6 7.1 8.1  HGB 11.8* 13.5 14.1 13.7 14.5  HCT 36.9 42.4 42.8 41.8 45.3  PLT 346 374 388 333 287  MCV 97.1 94.9 95.5 94.8 95.4  MCH 31.1 30.2 31.5 31.1 30.5  MCHC 32.0 31.8 32.9 32.8 32.0  RDW 13.2 12.6 12.9 12.7 13.0  LYMPHSABS 0.6* 0.9 0.8 1.0 0.9  MONOABS 0.6 0.5 0.3 0.3 0.4  EOSABS 0.0 0.0 0.1 0.2 0.2  BASOSABS 0.0 0.0 0.0 0.0 0.0    Chemistries  Recent Labs  Lab 04/10/19 0433 04/12/19 0312 04/13/19 0544 04/14/19 0340 04/14/19 0734 04/15/19 0341 04/16/19 0358  NA 148* 149* 148*  --  145 144 144  K 3.7 3.0* 3.3*  --  3.7 3.3* 4.4  CL 113* 110 112*  --  110 112* 113*  CO2 _0 --  _1 GLUCOSE 213* 147* 195*  --  105* 94 99  BUN 30* 31* 21*  --  _2 CREATININE 0.66 0.74 0.75 0.71 0.71 0.68 0.75  CALCIUM 8.4* 8.7* 8.6*  --  8.4* 8.2* 8.7*  MG  --   --   --   --  2.3 2.4 2.4  AST 34 39 59*  --   --  22 23  ALT 37 38 121*  --   --  55* 44  ALKPHOS 69 69 68  --   --  66 70  BILITOT 0.2* 0.9 0.7  --   --  0.2* 0.4   ------------------------------------------------------------------------------------------------------------------ No results for input(s): CHOL, HDL, LDLCALC, TRIG, CHOLHDL, LDLDIRECT in the last 72 hours.  Lab Results  Component Value Date   HGBA1C 6.3 (H) 04/12/2019   ------------------------------------------------------------------------------------------------------------------ No results for input(s): TSH, T4TOTAL, T3FREE, THYROIDAB in the last 72  hours.  Invalid input(s): FREET3  Cardiac Enzymes No results for input(s): CKMB, TROPONINI, MYOGLOBIN in the last  168 hours.  Invalid input(s): CK ------------------------------------------------------------------------------------------------------------------    Component Value Date/Time   BNP 16.3 04/16/2019 0358    Micro Results Recent Results (from the past 240 hour(s))  SARS Coronavirus 2 (CEPHEID- Performed in [facility] hospital lab), Hosp Order     Status: Abnormal   Collection Time: 04/07/19  2:53 PM  Result Value Ref Range Status   SARS Coronavirus 2 POSITIVE (A) NEGATIVE Final    Comment: RESULT CALLED TO, READ BACK BY AND VERIFIED WITH: Ethelda Chick [id number] @ 1700 BY J SCOTTON (NOTE) If result is NEGATIVE SARS-CoV-2 target nucleic acids are NOT DETECTED. The SARS-CoV-2 RNA is generally detectable in upper and lower  respiratory specimens during the acute phase of infection. The lowest  concentration of SARS-CoV-2 viral copies this assay can detect is 250  copies / mL. A negative result does not preclude SARS-CoV-2 infection  and should not be used as the sole basis for treatment or other  patient management decisions.  A negative result may occur with  improper specimen collection / handling, submission of specimen other  than nasopharyngeal swab, presence of viral mutation(s) within the  areas targeted by this assay, and inadequate number of viral copies  (<250 copies / mL). A negative result must be combined with clinical  observations, patient history, and epidemiological information. If result is POSITIVE SARS-CoV-2 target nucleic acids are DETEC TED. The SARS-CoV-2 RNA is generally detectable in upper and lower  respiratory specimens during the acute phase of infection.  Positive  results are indicative of active infection with SARS-CoV-2.  Clinical  correlation with patient history and other diagnostic information is  necessary to determine patient  infection status.  Positive results do  not rule out bacterial infection or co-infection with other viruses. If result is PRESUMPTIVE POSTIVE SARS-CoV-2 nucleic acids MAY BE PRESENT.   A presumptive positive result was obtained on the submitted specimen  and confirmed on repeat testing.  While 2019 novel coronavirus  (SARS-CoV-2) nucleic acids may be present in the submitted sample  additional confirmatory testing may be necessary for epidemiological  and / or clinical management purposes  to differentiate between  SARS-CoV-2 and other Sarbecovirus currently known to infect humans.  If clinically indicated additional testing with an alternate test  methodology (LAB7 [id number]) is advised. The SARS-CoV-2 RNA is generally  detectable in upper and lower respiratory specimens during the acute  phase of infection. The expected result is Negative. Fact Sheet for Patients:  StrictlyIdeas.no Fact Sheet for Healthcare Providers: BankingDealers.co.za This test is not yet approved or cleared by the [place] FDA and has been authorized for detection and/or diagnosis of SARS-CoV-2 by FDA under an Emergency Use Authorization (EUA).  This EUA will remain in effect (meaning this test can be used) for the duration of the COVID-19 declaration under Section 564(b)(1) of the Act, 21 U.S.C. section 360bbb-3(b)(1), unless the authorization is terminated or revoked sooner. Performed at [facility], [place] [address], [place], [place] [address]   Blood culture (routine x 2)     Status: None   Collection Time: 04/07/19  4:21 PM  Result Value Ref Range Status   Specimen Description   Final    BLOOD RIGHT HAND Performed at [facility] [address], [place], [place] [address]    Special Requests   Final    BOTTLES DRAWN AEROBIC ONLY Blood Culture adequate volume Performed at [facility] [address], [place], [place] [address]  Culture   Final    NO GROWTH 5 DAYS Performed at [facility], [place] [address] [place], [place] [address]    Report Status 04/12/2019 FINAL  Final  Blood culture (routine x 2)     Status: None   Collection Time: 04/07/19  4:26 PM  Result Value Ref Range Status   Specimen Description   Final    BLOOD RIGHT HAND Performed at [facility] [address], [place], [place] [address]    Special Requests   Final    BOTTLES DRAWN AEROBIC ONLY Blood Culture adequate volume Performed at [facility] [address] [place], [place] [address]    Culture   Final    NO GROWTH 5 DAYS Performed at [facility], [place] [address], [place], [place] [address]    Report Status 04/12/2019 FINAL  Final  MRSA PCR Screening     Status: None   Collection Time: 04/07/19 11:58 PM  Result Value Ref Range Status   MRSA by PCR NEGATIVE NEGATIVE Final    Comment:        The GeneXpert MRSA Assay (FDA approved for NASAL specimens only), is one component of a comprehensive MRSA colonization surveillance program. It is not intended to diagnose MRSA infection nor to guide or monitor treatment for MRSA infections. Performed at [facility], [place] [address], [place], [place] [address]   Urine culture     Status: Abnormal   Collection Time: 04/08/19  8:30 AM  Result Value Ref Range Status   Specimen Description   Final    URINE, RANDOM Performed at [facility] [address], [place], [place] [address]    Special Requests   Final    NONE Performed at [facility], [place] [address] [place], [place] [address]    Culture >=100,000 COLONIES/mL ESCHERICHIA COLI (A)  Final   Report Status 04/10/2019 FINAL  Final   Organism ID, Bacteria ESCHERICHIA COLI (A)  Final      Susceptibility   Escherichia coli - MIC*    AMPICILLIN 4 SENSITIVE Sensitive     CEFAZOLIN <=4 SENSITIVE  Sensitive     CEFTRIAXONE <=1 SENSITIVE Sensitive     CIPROFLOXACIN <=0.25 SENSITIVE Sensitive     GENTAMICIN <=1 SENSITIVE Sensitive     IMIPENEM <=0.25 SENSITIVE Sensitive     NITROFURANTOIN <=16 SENSITIVE Sensitive     TRIMETH/SULFA <=20 SENSITIVE Sensitive     AMPICILLIN/SULBACTAM <=2 SENSITIVE Sensitive     PIP/TAZO <=4 SENSITIVE Sensitive     Extended ESBL NEGATIVE Sensitive     * >=100,000 COLONIES/mL ESCHERICHIA COLI  Respiratory Panel by PCR     Status: None   Collection Time: 04/08/19  8:45 AM  Result Value Ref Range Status   Adenovirus NOT DETECTED NOT DETECTED Final   Coronavirus 229E NOT DETECTED NOT DETECTED Final    Comment: (NOTE) The Coronavirus on the Respiratory Panel, DOES NOT test for the novel  Coronavirus (2019 nCoV)    Coronavirus HKU1 NOT DETECTED NOT DETECTED Final   Coronavirus NL63 NOT DETECTED NOT DETECTED Final   Coronavirus OC43 NOT DETECTED NOT DETECTED Final   Metapneumovirus NOT DETECTED NOT DETECTED Final   Rhinovirus / Enterovirus NOT DETECTED NOT DETECTED Final   Influenza A NOT DETECTED NOT DETECTED Final   Influenza B NOT DETECTED NOT DETECTED Final   Parainfluenza Virus 1 NOT DETECTED NOT DETECTED Final   Parainfluenza Virus 2 NOT  DETECTED NOT DETECTED Final   Parainfluenza Virus 3 NOT DETECTED NOT DETECTED Final   Parainfluenza Virus 4 NOT DETECTED NOT DETECTED Final   Respiratory Syncytial Virus NOT DETECTED NOT DETECTED Final   Bordetella pertussis NOT DETECTED NOT DETECTED Final   Chlamydophila pneumoniae NOT DETECTED NOT DETECTED Final   Mycoplasma pneumoniae NOT DETECTED NOT DETECTED Final    Comment: Performed at [facility], [place] [address], [place], [place] [address]    Radiology Reports Dg Abd 1 View  Result Date: 04/08/2019 CLINICAL DATA:  OG tube placement. EXAM: ABDOMEN - 1 VIEW COMPARISON:  None. FINDINGS: OG tube is in place with the tip in the distal stomach or first portion of the duodenum. IMPRESSION: As above.  Electronically Signed   By: Inge Rise M.D.   On: 04/08/2019 15:05   Ct Chest Wo Contrast  Result Date: 04/15/2019 CLINICAL DATA:  Possible cavitary lesion on recent chest x-ray EXAM: CT CHEST WITHOUT CONTRAST TECHNIQUE: Multidetector CT imaging of the chest was performed following the standard protocol without IV contrast. COMPARISON:  04/15/2019 FINDINGS: Cardiovascular: Somewhat limited due to lack of IV contrast. No significant atherosclerotic calcifications are seen. No aneurysmal dilatation or dissection is noted. No cardiac enlargement is seen. At the confluence of the right internal jugular vein and right subclavian vein there is a rounded lipomatous lesion identified which likely sits adjacent to the confluence as opposed to actually being within the vein. Mediastinum/Nodes: Thoracic inlet is within normal limits. Few scattered small reactive lymph nodes are identified within the hila and mediastinum. The esophagus is within normal limits. Lungs/Pleura: Lungs again demonstrate multifocal confluent infiltrates throughout both lungs similar to that seen on recent chest x-ray consistent with the given clinical history of COVID-19 positivity. No sizable effusion is noted. No pneumothorax is seen. The area of lucency noted on recent chest x-ray represents an area of spared lung as opposed to a true cavitary lesion. Upper Abdomen: Visualized upper abdomen is within normal limits. Musculoskeletal: No chest wall mass or suspicious bone lesions identified. IMPRESSION: Multifocal confluent infiltrates throughout both lungs consistent with the patient's given clinical history of COVID-19 positivity. The area of lucency seen on recent chest x-ray represents an area of spared lung on the right not a true cavitary lesion. No other focal abnormality is noted. Electronically Signed   By: Inez Catalina M.D.   On: 04/15/2019 15:03   Dg Chest Port 1 View  Result Date: 04/15/2019 CLINICAL DATA:  Shortness of breath,  COVID-19 EXAM: PORTABLE CHEST 1 VIEW COMPARISON:  Portable exam 0940 hours compared to 04/08/2019 FINDINGS: Normal heart size, mediastinal contours, and pulmonary vascularity. Patchy BILATERAL pulmonary infiltrates consistent with pneumonia. Area of relative lucency in the RIGHT mid lung may represent spared parenchyma though attention on follow-up imaging recommended to exclude developing cavitation. No pleural effusion or pneumothorax. Bones unremarkable. IMPRESSION: Patchy BILATERAL pulmonary infiltrates consistent with pneumonia. Lucency in the RIGHT mid lung, likely spared lung, but recommend attention on follow-up exams to exclude developing cavitation. Electronically Signed   By: Lavonia Dana M.D.   On: 04/15/2019 10:02   Dg Chest Port 1 View  Result Date: 04/08/2019 CLINICAL DATA:  Multifocal pneumonia.  COVID Positive. EXAM: PORTABLE CHEST 1 VIEW COMPARISON:  04/07/2019 FINDINGS: Endotracheal tube tip is 11 mm above the carina. NG tube tip is in the distal stomach or duodenal bulb. The patient has extensive bilateral hazy pulmonary infiltrates which appear less prominent than on the prior study but the aeration  is improved in both lungs as compared to the prior exam. Heart size and vascularity are normal. No effusions. Bones are normal. IMPRESSION: 1. Extensive bilateral pulmonary infiltrates consistent with the patient's history of COVID-19. 2. Endotracheal tube is 11 mm above the carina and could be slightly retracted. Electronically Signed   By: Lorriane Shire M.D.   On: 04/08/2019 15:06   Dg Chest Port 1 View  Result Date: 04/07/2019 CLINICAL DATA:  Shortness of breath since last Friday, cough, chest pain, history asthma, reported COVID-19 positive test at another hospital EXAM: PORTABLE CHEST 1 VIEW COMPARISON:  Portable exam 1455 hours without priors for comparison FINDINGS: Upper normal heart size. Normal mediastinal contours. Patchy BILATERAL pulmonary infiltrates likely infectious. No definite  pleural effusion or pneumothorax. Osseous structures unremarkable. IMPRESSION: Patchy BILATERAL pulmonary infiltrates consistent with pneumonia, including atypical etiologies. Electronically Signed   By: Lavonia Dana M.D.   On: 04/07/2019 15:34

## 2019-04-16 NOTE — Progress Notes (Signed)
Inpatient Diabetes Program Recommendations  AACE/ADA: New Consensus Statement on Inpatient Glycemic Control (2015)  Target Ranges:  Prepandial:   less than 140 mg/dL      Peak postprandial:   less than 180 mg/dL (1-2 hours)      Critically ill patients:  140 - 180 mg/dL   Lab Results  Component Value Date   GLUCAP 152 (H) 04/16/2019   HGBA1C 6.3 (H) 04/12/2019    Review of Glycemic Control  Spoke with daughter-in-law, Gerald Stabs, regarding pt's pre-diabetes diagnosis. Gerald Stabs states pt's weight has been stable, she drinks some regular sodas and juice, and gets very little exercise. Explained importance of weight-loss and exercise with controlling blood sugars. Discussed checking blood sugars several times/week and taking logbook to PCP for review. Explained HgbA1C of 6.3% is pre-diabetes diagnosis and means average blood sugar each day is approximately 135 mg/dL. Stressed importance of f/u with PCP.  Inpatient Diabetes Program Recommendations:     Please order glucose meter and supplies when pt is discharged.  Will continue to follow while inpatient.   Thank you. Lorenda Peck, RD, LDN, CDE Inpatient Diabetes Coordinator [phone]

## 2019-04-16 NOTE — Progress Notes (Signed)
PT Cancellation Note  Patient Details Name: Joyce Mcguire MRN: [id number] DOB: December 21, 1965   Cancelled Treatment:    Reason Eval/Treat Not Completed: Fatigue/lethargy limiting ability to participate;Medical issues which prohibited therapy   Attempted to see 10:15 and pt had just completed session with OT and was fatigued.   Attempted again in pm and pt had completed shower with nursing and very fatigued.   Will attempt to see 04/17/19   Rexanne Mano, PT 04/16/2019, 3:58 PM

## 2019-04-16 NOTE — Progress Notes (Signed)
Nutrition Follow-up  DOCUMENTATION CODES:   Not applicable  INTERVENTION:   Continue Ensure Enlive po TID, each supplement provides 350 kcal and 20 grams of protein   NUTRITION DIAGNOSIS:   Inadequate oral intake related to acute illness as evidenced by NPO status.  Being addressed via supplements  GOAL:   Patient will meet greater than or equal to 90% of their needs  Progressing  MONITOR:   PO intake, Supplement acceptance, Labs, Weight trends, Skin  REASON FOR ASSESSMENT:   Consult, Ventilator Enteral/tube feeding initiation and management  ASSESSMENT:   53 yo female with no PMH admitted with severe acute respiratory failure due to COVID-19 pneumonia.   6/02 Intubated 6/03 TF initiated 6/04 Extubated  PO intake improving over the last several days, 50-75% of meals  Weight relatively stable since admission  Labs: reviewed Meds: ss novolog, lantus, Vit C, zinc sulfate   Diet Order:   Diet Order            Diet Carb Modified Fluid consistency: Thin; Room service appropriate? Yes  Diet effective now              EDUCATION NEEDS:   Not appropriate for education at this time  Skin:  Skin Assessment: Reviewed RN Assessment  Last BM:  6/09  Height:   Ht Readings from Last 1 Encounters:  04/08/19 5' (1.524 m)    Weight:   Wt Readings from Last 1 Encounters:  04/15/19 66 kg    Ideal Body Weight:  45.5 kg  BMI:  Body mass index is 28.42 kg/m.  Estimated Nutritional Needs:   Kcal:  1620-1820 kcals   Protein:  80-90 g   Fluid:  >/= 1.6 L   Kerman Passey MS, RD, LDN, CNSC [phone] Pager  [phone] Weekend/On-Call Pager

## 2019-04-17 ENCOUNTER — Inpatient Hospital Stay (HOSPITAL_COMMUNITY): Payer: BC Managed Care – PPO

## 2019-04-17 LAB — FERRITIN: Ferritin: 282 ng/mL (ref 11–307)

## 2019-04-17 LAB — GLUCOSE, CAPILLARY
Glucose-Capillary: 99 mg/dL (ref 70–99)
Glucose-Capillary: 125 mg/dL — ABNORMAL HIGH (ref 70–99)
Glucose-Capillary: 143 mg/dL — ABNORMAL HIGH (ref 70–99)
Glucose-Capillary: 229 mg/dL — ABNORMAL HIGH (ref 70–99)

## 2019-04-17 LAB — URINALYSIS, MICROSCOPIC (REFLEX)
Bacteria, UA: NONE SEEN
RBC / HPF: NONE SEEN RBC/hpf (ref 0–5)
Squamous Epithelial / HPF: NONE SEEN (ref 0–5)
WBC, UA: NONE SEEN WBC/hpf (ref 0–5)

## 2019-04-17 LAB — URINALYSIS, ROUTINE W REFLEX MICROSCOPIC
Bilirubin Urine: NEGATIVE
Glucose, UA: NEGATIVE mg/dL
Hgb urine dipstick: NEGATIVE
Ketones, ur: NEGATIVE mg/dL
Leukocytes,Ua: NEGATIVE
Nitrite: NEGATIVE
Specific Gravity, Urine: 1.025 (ref 1.005–1.030)
pH: 6 (ref 5.0–8.0)

## 2019-04-17 LAB — URIC ACID: Uric Acid, Serum: 7.9 mg/dL — ABNORMAL HIGH (ref 2.5–7.1)

## 2019-04-17 LAB — CREATININE, URINE, RANDOM: Creatinine, Urine: 231 mg/dL

## 2019-04-17 LAB — C-REACTIVE PROTEIN: CRP: 2.7 mg/dL — ABNORMAL HIGH (ref <1.0)

## 2019-04-17 LAB — OSMOLALITY: Osmolality: 299 mOsm/kg — ABNORMAL HIGH (ref 275–295)

## 2019-04-17 LAB — OSMOLALITY, URINE: Osmolality, Ur: 888 mosm/kg (ref 300–900)

## 2019-04-17 LAB — SODIUM, URINE, RANDOM: Sodium, Ur: 11 mmol/L

## 2019-04-17 LAB — BRAIN NATRIURETIC PEPTIDE: B Natriuretic Peptide: 83.1 pg/mL (ref 0.0–100.0)

## 2019-04-17 MED ORDER — INSULIN ASPART 100 UNIT/ML ~~LOC~~ SOLN
3.0000 [IU] | Freq: Three times a day (TID) | SUBCUTANEOUS | Status: DC
  Administered 2019-04-17 – 2019-04-20 (×9): 3 [IU] via SUBCUTANEOUS

## 2019-04-17 MED ORDER — METHYLPREDNISOLONE SODIUM SUCC 125 MG IJ SOLR
60.0000 mg | Freq: Every day | INTRAMUSCULAR | Status: DC
  Administered 2019-04-17 – 2019-04-19 (×3): 60 mg via INTRAVENOUS
  Filled 2019-04-17 (×3): qty 2

## 2019-04-17 MED ORDER — LACTATED RINGERS IV SOLN
INTRAVENOUS | Status: AC
  Administered 2019-04-17 (×2): via INTRAVENOUS

## 2019-04-17 NOTE — Progress Notes (Signed)
Patient's missing Android cell phone phone FOUND in her room, in one of her patient's belonging bags.  Patient believes she left her charger at home.  Nursing staff will assist in charging her cell phone at the nurses station.  Patient very happy and thankful.  Patient reports she will update her family this morning.

## 2019-04-17 NOTE — Progress Notes (Signed)
PROGRESS NOTE                                                                                                                                                                                                             Patient Demographics:    Joyce Mcguire, is a 53 y.o. female, DOB - 1966/05/20, KGY:[id number]  Outpatient Primary MD for the patient is System, Pcp Not In    LOS - 10  Admit date - 04/07/2019    Chief Complaint  Patient presents with   Shortness of Breath       Brief Narrative  Joyce Mcguire is a 53 y.o. F with no significant PMHx who presented with 5 days cough, progressive SOB.  In the ER, SpO2 < 90%, required 4L [place].  CXR showed bilateral opacities, SARS-CoV-2 NAA positive.  Transferred to [facility], intubated on hospital day 2 due to tachypnea, fatigue, escalating O2 requirements. She was given Remdesivir, Actemra and convalescent plasma with improvement.   Subjective:   Patient in bed, appears comfortable, denies any headache, no fever, no chest pain or pressure, +ve shortness of breath , no abdominal pain. No focal weakness.   Assessment  & Plan :     1. Acute Hypoxic Resp. Failure due to Acute Covid 19 Viral Illness during the ongoing 2020 Covid 19 Pandemic - requiring Intubation and Vent support, received Remdesivir, Actemra, Steroids & Plasma, with some improvement and was eventually extubated, remains on high flow nasal cannula oxygen, CT chest and chest x-ray show extensive interstitial lung injury, encouraged to sit up in chair, added Pulm toiletry through IS + Flutter valve, to new to monitor still quite hypoxic and tenuous.  CRP is creeping up and she remains quite hypoxic will resume IV steroids on 04/17/2019 and monitor closely.  ABG     Component Value Date/Time   PHART 7.356 04/08/2019 1830   PCO2ART 44.1 04/08/2019 1830   PO2ART 211.0 (H) 04/08/2019 1830   HCO3 24.7 04/08/2019 1830   TCO2 26  04/08/2019 1830   ACIDBASEDEF 1.0 04/08/2019 1830   O2SAT 100.0 04/08/2019 1830    COVID-19 Labs  Recent Labs    04/15/19 0341 04/16/19 0358 04/17/19 0400  DDIMER 0.83* 0.78* 0.89*  FERRITIN 310* 321* 282  LDH 392* 427* 154  CRP <0.8 0.9 2.7*    Lab Results  Component Value Date   SARSCOV2NAA POSITIVE (A) 04/07/2019     Hepatic Function Latest Ref Rng & Units 04/17/2019 04/16/2019 04/15/2019  Total Protein 6.5 - 8.1 g/dL 5.4(L) 6.0(L) 5.6(L)  Albumin 3.5 - 5.0 g/dL 2.6(L) 3.1(L) 2.8(L)  AST 15 - 41 U/L 13(L) 23 22  ALT 0 - 44 U/L 15 44 55(H)  Alk Phosphatase 38 - 126 U/L 54 70 66  Total Bilirubin 0.3 - 1.2 mg/dL 0.5 0.4 0.2(L)        Component Value Date/Time   BNP 83.1 04/17/2019 0400      2.  Transaminitis -  Due to combination of Covid + Actemra, no pain, LFTs are now normal.  3.  Toxic Encephalopathy - resolved.  4. UTI - pan sensitive Ecoli, on Rocephin x 3 days.   5.  Acute on chronic nonspecific CHF.  No previous echo EF on file.  He has been adequately diuresed.  Currently euvolemic.  Outpatient echocardiogram and cardiology follow-up will be needed post discharge.    6.  Hypokalemia.  Replaced and stable.  7.  ? cavitary lung lesion on chest x-ray.  Stable CT no cavitary lesion.  8.  AKI.  Due to dehydration.  Gentle hydration with IV fluids.  Hold further diuresis and monitor.    9. Pre DM 2 - A1c 6.3, Lantus + ISS, since steroids are resumed will add pre-meal NovoLog as well.   Lab Results  Component Value Date   HGBA1C 6.3 (H) 04/12/2019    CBG (last 3)  Recent Labs    04/16/19 1557 04/16/19 2123 04/17/19 0753  GLUCAP 152* 121* 99    Condition - Fair  Family Communication  : Updated daughter on 04/17/2019.  Code Status :  Full  Diet :   Diet Order            Diet Carb Modified Fluid consistency: Thin; Room service appropriate? Yes  Diet effective now               Disposition Plan  :  Home once better  Consults  :   PCCM  Procedures  :  None  PUD Prophylaxis : None  DVT Prophylaxis  :  Lovenox    Lab Results  Component Value Date   PLT 205 04/17/2019    Inpatient Medications  Scheduled Meds:  chlorhexidine gluconate (MEDLINE KIT)  15 mL Mouth Rinse BID   Chlorhexidine Gluconate Cloth  6 each Topical Q0600   enoxaparin (LOVENOX) injection  40 mg Subcutaneous Q12H   feeding supplement (ENSURE ENLIVE)  237 mL Oral TID BM   insulin aspart  0-15 Units Subcutaneous TID WC   insulin glargine  12 Units Subcutaneous Daily   isosorbide mononitrate  30 mg Oral Daily   OLANZapine  2.5 mg Oral QHS   vitamin C  500 mg Oral Daily   zinc sulfate  220 mg Oral Daily   Continuous Infusions:  lactated ringers 75 mL/hr at 04/17/19 0915   PRN Meds:.acetaminophen, albuterol, ALPRAZolam, guaiFENesin-dextromethorphan, HYDROcodone-acetaminophen, [DISCONTINUED] ondansetron **OR** ondansetron (ZOFRAN) IV, polyethylene glycol  Antibiotics  :    Anti-infectives (From admission, onward)   Start     Dose/Rate Route Frequency Ordered Stop   04/13/19 1000  cefTRIAXone (ROCEPHIN) 1 g in sodium chloride 0.9 % 100 mL IVPB     1 g 200 mL/hr over 30 Minutes Intravenous Every 24 hours 04/13/19 0758 04/15/19 1015   04/08/19 2345  remdesivir 100 mg in sodium chloride 0.9 % 230  mL IVPB     100 mg 500 mL/hr over 30 Minutes Intravenous Every 24 hours 04/07/19 2330 04/11/19 2340   04/07/19 2345  remdesivir 200 mg in sodium chloride 0.9 % 210 mL IVPB     200 mg 500 mL/hr over 30 Minutes Intravenous Once 04/07/19 2330 04/08/19 0037       Time Spent in minutes  30   Lala Lund M.D on 04/17/2019 at 11:09 AM  To page go to [email or web address] - password [facility]  [facility] -  Office  [phone]  See all Orders from today for further details    Objective:   Vitals:   04/17/19 0600 04/17/19 0700 04/17/19 0710 04/17/19 0756  BP:    107/78  Pulse: (!) 109 (!) 130 (!) 112 (!) 114  Resp:  (!) 29 (!) 28  (!) 24  Temp:    97.9 F (36.6 C)  TempSrc:    Oral  SpO2:  (!) 88% 93% 95%  Weight:      Height:        Wt Readings from Last 3 Encounters:  04/17/19 64.9 kg     Intake/Output Summary (Last 24 hours) at 04/17/2019 1109 Last data filed at 04/17/2019 0930 Gross per 24 hour  Intake 660 ml  Output 200 ml  Net 460 ml     Physical Exam  Awake Alert, Oriented X 3, No new F.N deficits, Normal affect [place].AT,PERRAL Supple Neck,No JVD, No cervical lymphadenopathy appriciated.  Symmetrical Chest wall movement, Good air movement bilaterally, CTAB RRR,No Gallops, Rubs or new Murmurs, No Parasternal Heave +ve B.Sounds, Abd Soft, No tenderness, No organomegaly appriciated, No rebound - guarding or rigidity. No Cyanosis, Clubbing or edema, No new Rash or bruise    Data Review:    CBC Recent Labs  Lab 04/12/19 0312 04/13/19 0544 04/15/19 0341 04/16/19 0358 04/17/19 0400  WBC 9.7 8.6 7.1 8.1 7.0  HGB 13.5 14.1 13.7 14.5 8.2*  HCT 42.4 42.8 41.8 45.3 25.7*  PLT 374 388 333 287 205  MCV 94.9 95.5 94.8 95.4 71.6*  MCH 30.2 31.5 31.1 30.5 22.8*  MCHC 31.8 32.9 32.8 32.0 31.9  RDW 12.6 12.9 12.7 13.0 14.3  LYMPHSABS 0.9 0.8 1.0 0.9 1.0  MONOABS 0.5 0.3 0.3 0.4 0.6  EOSABS 0.0 0.1 0.2 0.2 0.1  BASOSABS 0.0 0.0 0.0 0.0 0.0    Chemistries  Recent Labs  Lab 04/12/19 0312 04/13/19 0544 04/14/19 0340 04/14/19 0734 04/15/19 0341 04/16/19 0358 04/17/19 0400  NA 149* 148*  --  145 144 144 142  K 3.0* 3.3*  --  3.7 3.3* 4.4 3.6  CL 110 112*  --  110 112* 113* 111  CO2 31 28  --  26 26 22 22   GLUCOSE 147* 195*  --  105* 94 99 84  BUN 31* 21*  --  17 16 19  31*  CREATININE 0.74 0.75 0.71 0.71 0.68 0.75 1.70*  CALCIUM 8.7* 8.6*  --  8.4* 8.2* 8.7* 8.2*  MG  --   --   --  2.3 2.4 2.4 2.1  AST 39 59*  --   --  22 23 13*  ALT 38 121*  --   --  55* 44 15  ALKPHOS 69 68  --   --  66 70 54  BILITOT 0.9 0.7  --   --  0.2* 0.4 0.5    ------------------------------------------------------------------------------------------------------------------ No results for input(s): CHOL, HDL, LDLCALC, TRIG, CHOLHDL, LDLDIRECT in the last 72 hours.  Lab  Results  Component Value Date   HGBA1C 6.3 (H) 04/12/2019   ------------------------------------------------------------------------------------------------------------------ No results for input(s): TSH, T4TOTAL, T3FREE, THYROIDAB in the last 72 hours.  Invalid input(s): FREET3  Cardiac Enzymes No results for input(s): CKMB, TROPONINI, MYOGLOBIN in the last 168 hours.  Invalid input(s): CK ------------------------------------------------------------------------------------------------------------------    Component Value Date/Time   BNP 83.1 04/17/2019 0400    Micro Results Recent Results (from the past 240 hour(s))  SARS Coronavirus 2 (CEPHEID- Performed in [place] hospital lab), Hosp Order     Status: Abnormal   Collection Time: 04/07/19  2:53 PM   Specimen: Nasopharyngeal Swab  Result Value Ref Range Status   SARS Coronavirus 2 POSITIVE (A) NEGATIVE Final    Comment: RESULT CALLED TO, READ BACK BY AND VERIFIED WITH: Ethelda Chick [phone] @ [facility] (NOTE) If result is NEGATIVE SARS-CoV-2 target nucleic acids are NOT DETECTED. The SARS-CoV-2 RNA is generally detectable in upper and lower  respiratory specimens during the acute phase of infection. The lowest  concentration of SARS-CoV-2 viral copies this assay can detect is 250  copies / mL. A negative result does not preclude SARS-CoV-2 infection  and should not be used as the sole basis for treatment or other  patient management decisions.  A negative result may occur with  improper specimen collection / handling, submission of specimen other  than nasopharyngeal swab, presence of viral mutation(s) within the  areas targeted by this assay, and inadequate number of viral copies  (<250 copies /  mL). A negative result must be combined with clinical  observations, patient history, and epidemiological information. If result is POSITIVE SARS-CoV-2 target nucleic acids are DETEC TED. The SARS-CoV-2 RNA is generally detectable in upper and lower  respiratory specimens during the acute phase of infection.  Positive  results are indicative of active infection with SARS-CoV-2.  Clinical  correlation with patient history and other diagnostic information is  necessary to determine patient infection status.  Positive results do  not rule out bacterial infection or co-infection with other viruses. If result is PRESUMPTIVE POSTIVE SARS-CoV-2 nucleic acids MAY BE PRESENT.   A presumptive positive result was obtained on the submitted specimen  and confirmed on repeat testing.  While 2019 novel coronavirus  (SARS-CoV-2) nucleic acids may be present in the submitted sample  additional confirmatory testing may be necessary for epidemiological  and / or clinical management purposes  to differentiate between  SARS-CoV-2 and other Sarbecovirus currently known to infect humans.  If clinically indicated additional testing with an alternate test  methodology (LAB7 [id number]) is advised. The SARS-CoV-2 RNA is generally  detectable in upper and lower respiratory specimens during the acute  phase of infection. The expected result is Negative. Fact Sheet for Patients:  StrictlyIdeas.no Fact Sheet for Healthcare Providers: BankingDealers.co.za This test is not yet approved or cleared by the [place] FDA and has been authorized for detection and/or diagnosis of SARS-CoV-2 by FDA under an Emergency Use Authorization (EUA).  This EUA will remain in effect (meaning this test can be used) for the duration of the COVID-19 declaration under Section 564(b)(1) of the Act, 21 U.S.C. section 360bbb-3(b)(1), unless the authorization is terminated or revoked  sooner. Performed at [facility], [place] [address] [place], [place] [address]   Blood culture (routine x 2)     Status: None   Collection Time: 04/07/19  4:21 PM   Specimen: BLOOD RIGHT HAND  Result Value Ref Range Status   Specimen  Description   Final    BLOOD RIGHT HAND Performed at [facility], [place] [address] [place], [place] [address]    Special Requests   Final    BOTTLES DRAWN AEROBIC ONLY Blood Culture adequate volume Performed at [place] [address], [place], [place] [address]    Culture   Final    NO GROWTH 5 DAYS Performed at [facility], [place] [address] [place], [place] [address]    Report Status 04/12/2019 FINAL  Final  Blood culture (routine x 2)     Status: None   Collection Time: 04/07/19  4:26 PM   Specimen: BLOOD RIGHT HAND  Result Value Ref Range Status   Specimen Description   Final    BLOOD RIGHT HAND Performed at [place] [address], [place], [place] [address]    Special Requests   Final    BOTTLES DRAWN AEROBIC ONLY Blood Culture adequate volume Performed at [place] [address], [place], [place] [address]    Culture   Final    NO GROWTH 5 DAYS Performed at [facility], [place] [address], [place], [place] [address]    Report Status 04/12/2019 FINAL  Final  MRSA PCR Screening     Status: None   Collection Time: 04/07/19 11:58 PM   Specimen: Nasopharyngeal  Result Value Ref Range Status   MRSA by PCR NEGATIVE NEGATIVE Final    Comment:        The GeneXpert MRSA Assay (FDA approved for NASAL specimens only), is one component of a comprehensive MRSA colonization surveillance program. It is not intended to diagnose MRSA infection nor to guide or monitor treatment for MRSA infections. Performed at [facility], [place] [address], [place], [place] [address]   Urine culture     Status: Abnormal    Collection Time: 04/08/19  8:30 AM   Specimen: Urine, Random  Result Value Ref Range Status   Specimen Description   Final    URINE, RANDOM Performed at [place] [address] [place], [place] [address]    Special Requests   Final    NONE Performed at [facility], [place] [address], [place],  [address]    Culture >=100,000 COLONIES/mL ESCHERICHIA COLI (A)  Final   Report Status 04/10/2019 FINAL  Final   Organism ID, Bacteria ESCHERICHIA COLI (A)  Final      Susceptibility   Escherichia coli - MIC*    AMPICILLIN 4 SENSITIVE Sensitive     CEFAZOLIN <=4 SENSITIVE Sensitive     CEFTRIAXONE <=1 SENSITIVE Sensitive     CIPROFLOXACIN <=0.25 SENSITIVE Sensitive     GENTAMICIN <=1 SENSITIVE Sensitive     IMIPENEM <=0.25 SENSITIVE Sensitive     NITROFURANTOIN <=16 SENSITIVE Sensitive     TRIMETH/SULFA <=20 SENSITIVE Sensitive     AMPICILLIN/SULBACTAM <=2 SENSITIVE Sensitive     PIP/TAZO <=4 SENSITIVE Sensitive     Extended ESBL NEGATIVE Sensitive     * >=100,000 COLONIES/mL ESCHERICHIA COLI  Respiratory Panel by PCR     Status: None   Collection Time: 04/08/19  8:45 AM   Specimen: Nasopharyngeal Swab; Respiratory  Result Value Ref Range Status   Adenovirus NOT DETECTED NOT DETECTED Final   Coronavirus 229E NOT DETECTED NOT DETECTED Final    Comment: (NOTE) The Coronavirus on the Respiratory Panel, DOES NOT test for the novel  Coronavirus (2019 nCoV)  Coronavirus HKU1 NOT DETECTED NOT DETECTED Final   Coronavirus NL63 NOT DETECTED NOT DETECTED Final   Coronavirus OC43 NOT DETECTED NOT DETECTED Final   Metapneumovirus NOT DETECTED NOT DETECTED Final   Rhinovirus / Enterovirus NOT DETECTED NOT DETECTED Final   Influenza A NOT DETECTED NOT DETECTED Final   Influenza B NOT DETECTED NOT DETECTED Final   Parainfluenza Virus 1 NOT DETECTED NOT DETECTED Final   Parainfluenza Virus 2 NOT DETECTED NOT DETECTED Final   Parainfluenza Virus 3  NOT DETECTED NOT DETECTED Final   Parainfluenza Virus 4 NOT DETECTED NOT DETECTED Final   Respiratory Syncytial Virus NOT DETECTED NOT DETECTED Final   Bordetella pertussis NOT DETECTED NOT DETECTED Final   Chlamydophila pneumoniae NOT DETECTED NOT DETECTED Final   Mycoplasma pneumoniae NOT DETECTED NOT DETECTED Final    Comment: Performed at [facility], [place] [address] [place], [place] [address]    Radiology Reports Dg Abd 1 View  Result Date: 04/08/2019 CLINICAL DATA:  OG tube placement. EXAM: ABDOMEN - 1 VIEW COMPARISON:  None. FINDINGS: OG tube is in place with the tip in the distal stomach or first portion of the duodenum. IMPRESSION: As above. Electronically Signed   By: Inge Rise M.D.   On: 04/08/2019 15:05   Ct Chest Wo Contrast  Result Date: 04/15/2019 CLINICAL DATA:  Possible cavitary lesion on recent chest x-ray EXAM: CT CHEST WITHOUT CONTRAST TECHNIQUE: Multidetector CT imaging of the chest was performed following the standard protocol without IV contrast. COMPARISON:  04/15/2019 FINDINGS: Cardiovascular: Somewhat limited due to lack of IV contrast. No significant atherosclerotic calcifications are seen. No aneurysmal dilatation or dissection is noted. No cardiac enlargement is seen. At the confluence of the right internal jugular vein and right subclavian vein there is a rounded lipomatous lesion identified which likely sits adjacent to the confluence as opposed to actually being within the vein. Mediastinum/Nodes: Thoracic inlet is within normal limits. Few scattered small reactive lymph nodes are identified within the hila and mediastinum. The esophagus is within normal limits. Lungs/Pleura: Lungs again demonstrate multifocal confluent infiltrates throughout both lungs similar to that seen on recent chest x-ray consistent with the given clinical history of COVID-19 positivity. No sizable effusion is noted. No pneumothorax is seen. The area of lucency noted on recent chest  x-ray represents an area of spared lung as opposed to a true cavitary lesion. Upper Abdomen: Visualized upper abdomen is within normal limits. Musculoskeletal: No chest wall mass or suspicious bone lesions identified. IMPRESSION: Multifocal confluent infiltrates throughout both lungs consistent with the patient's given clinical history of COVID-19 positivity. The area of lucency seen on recent chest x-ray represents an area of spared lung on the right not a true cavitary lesion. No other focal abnormality is noted. Electronically Signed   By: Inez Catalina M.D.   On: 04/15/2019 15:03   Dg Chest Port 1 View  Result Date: 04/17/2019 CLINICAL DATA:  Shortness of breath. EXAM: PORTABLE CHEST 1 VIEW COMPARISON:  One-view chest x-ray 04/15/2019.  CT chest 04/15/2019 FINDINGS: Patchy bilateral airspace opacities are again noted. There is slight increase and upper lobe consolidation bilaterally. Lung volumes remain low. Visualized soft tissues and bony thorax are unremarkable. IMPRESSION: 1. Stable to slight increase in patchy bilateral airspace disease compatible with multi lobar COVID-19 pneumonia. Electronically Signed   By: San Morelle M.D.   On: 04/17/2019 08:25   Dg Chest Port 1 View  Result Date: 04/15/2019 CLINICAL DATA:  Shortness of breath, COVID-19  EXAM: PORTABLE CHEST 1 VIEW COMPARISON:  Portable exam 0940 hours compared to 04/08/2019 FINDINGS: Normal heart size, mediastinal contours, and pulmonary vascularity. Patchy BILATERAL pulmonary infiltrates consistent with pneumonia. Area of relative lucency in the RIGHT mid lung may represent spared parenchyma though attention on follow-up imaging recommended to exclude developing cavitation. No pleural effusion or pneumothorax. Bones unremarkable. IMPRESSION: Patchy BILATERAL pulmonary infiltrates consistent with pneumonia. Lucency in the RIGHT mid lung, likely spared lung, but recommend attention on follow-up exams to exclude developing cavitation.  Electronically Signed   By: Lavonia Dana M.D.   On: 04/15/2019 10:02   Dg Chest Port 1 View  Result Date: 04/08/2019 CLINICAL DATA:  Multifocal pneumonia.  COVID Positive. EXAM: PORTABLE CHEST 1 VIEW COMPARISON:  04/07/2019 FINDINGS: Endotracheal tube tip is 11 mm above the carina. NG tube tip is in the distal stomach or duodenal bulb. The patient has extensive bilateral hazy pulmonary infiltrates which appear less prominent than on the prior study but the aeration is improved in both lungs as compared to the prior exam. Heart size and vascularity are normal. No effusions. Bones are normal. IMPRESSION: 1. Extensive bilateral pulmonary infiltrates consistent with the patient's history of COVID-19. 2. Endotracheal tube is 11 mm above the carina and could be slightly retracted. Electronically Signed   By: Lorriane Shire M.D.   On: 04/08/2019 15:06   Dg Chest Port 1 View  Result Date: 04/07/2019 CLINICAL DATA:  Shortness of breath since last Friday, cough, chest pain, history asthma, reported COVID-19 positive test at another hospital EXAM: PORTABLE CHEST 1 VIEW COMPARISON:  Portable exam 1455 hours without priors for comparison FINDINGS: Upper normal heart size. Normal mediastinal contours. Patchy BILATERAL pulmonary infiltrates likely infectious. No definite pleural effusion or pneumothorax. Osseous structures unremarkable. IMPRESSION: Patchy BILATERAL pulmonary infiltrates consistent with pneumonia, including atypical etiologies. Electronically Signed   By: Lavonia Dana M.D.   On: 04/07/2019 15:34

## 2019-04-17 NOTE — Plan of Care (Signed)
Problem: Education: Goal: Knowledge of General Education information will improve Description Including pain rating scale, medication(s)/side effects and non-pharmacologic comfort measures Outcome: Progressing   Problem: Health Behavior/Discharge Planning: Goal: Ability to manage health-related needs will improve Outcome: Progressing   Problem: Clinical Measurements: Goal: Respiratory complications will improve Outcome: Progressing   Problem: Activity: Goal: Risk for activity intolerance will decrease Outcome: Progressing   Problem: Nutrition: Goal: Adequate nutrition will be maintained Outcome: Progressing   Problem: Coping: Goal: Level of anxiety will decrease Outcome: Progressing   Problem: Pain Managment: Goal: General experience of comfort will improve Outcome: Progressing   Problem: Safety: Goal: Ability to remain free from injury will improve Outcome: Progressing   

## 2019-04-17 NOTE — Progress Notes (Signed)
Physical Therapy Treatment Patient Details Name: Joyce Mcguire MRN: [id number] DOB: 1966-11-03 Today's Date: 04/17/2019    History of Present Illness 53 y.o.F with no significant PMHx who presented with 5 days cough, progressive SOB.  In the ER, SpO2 <90%, required 4L [place]. SARS-CoV-2 NAA positive. Transferred to [place], intubated 04/08/19 due to tachypnea, fatigue, escalating O2 requirements. She was givenRemdesivir, Actemra and convalescent plasma with improvement. Extubated 04/10/19    PT Comments    Patient requiring incr O2 compared to last visit with HR max higher with minimal activity. (now on 7L, HR 120-144 bpm). She requested review UE exercises given previously and completed these with several rest periods. She required max cues and visual demonstration to perform correctly (despite handout). Time spent explaining her symptoms and reasons she needs to remain in hospital.     Follow Up Recommendations  Home health PT;Supervision/Assistance - 24 hour(will continue to assess HH needs)     Equipment Recommendations  None recommended by PT    Recommendations for Other Services       Precautions / Restrictions Precautions Precautions: Fall    Mobility  Bed Mobility                  Transfers Overall transfer level: Needs assistance Equipment used: None Transfers: Sit to/from Stand Sit to Stand: Min guard;Supervision         General transfer comment: slight imbalance with first sit to stand; progressed to steady with supervision with repetition  Ambulation/Gait Ambulation/Gait assistance: Min guard Gait Distance (Feet): 20 Feet(to toilet; 20 ft back to chair) Assistive device: None Gait Pattern/deviations: Step-through pattern;Decreased step length - right;Decreased step length - left     General Gait Details: on 8L O2 with HR max 144 (at rest 120) with SaO2 93-95% walking short distance to bathroom; incr coughing with activity   Stairs              Wheelchair Mobility    Modified Rankin (Stroke Patients Only)       Balance Overall balance assessment: Needs assistance Sitting-balance support: No upper extremity supported;Feet supported Sitting balance-Leahy Scale: Good     Standing balance support: No upper extremity supported;During functional activity Standing balance-Leahy Scale: Fair                              Cognition Arousal/Alertness: Awake/alert Behavior During Therapy: WFL for tasks assessed/performed Overall Cognitive Status: Within Functional Limits for tasks assessed                                        Exercises Other Exercises Other Exercises: Seated UE exercises twith level 1 band. Patient required visual demonstration (in addition to the pictures/handout) with ultimately able to complete 4 exercises correctly 5-10 reps with rest periods between (scapular adduction, PNF pattern, elbow flexion, elbow extension)    General Comments General comments (skin integrity, edema, etc.): Lengthy discusssion re: pt's desire to go home and why she is not yet safe (from a cardiopulmonary perspective). Explained HR response and incr need for O2 with activity. She admits she does feel "kind of out of it. Can't think straight" and ultimately concedes that she needs to stay in hospital      Pertinent Vitals/Pain      Home Living  Prior Function            PT Goals (current goals can now be found in the care plan section) Acute Rehab PT Goals Patient Stated Goal: get well and go home soon Time For Goal Achievement: 04/28/19 Progress towards PT goals: Progressing toward goals(slowly due to poor activity tolerance)    Frequency    Min 3X/week      PT Plan Current plan remains appropriate    Co-evaluation              AM-PAC PT "6 Clicks" Mobility   Outcome Measure  Help needed turning from your back to your side while in a flat bed  without using bedrails?: None Help needed moving from lying on your back to sitting on the side of a flat bed without using bedrails?: None Help needed moving to and from a bed to a chair (including a wheelchair)?: A Little Help needed standing up from a chair using your arms (e.g., wheelchair or bedside chair)?: A Little Help needed to walk in hospital room?: A Little Help needed climbing 3-5 steps with a railing? : A Lot 6 Click Score: 19    End of Session Equipment Utilized During Treatment: Oxygen Activity Tolerance: Treatment limited secondary to medical complications (Comment)(coughing, decr SaO2, incr HR) Patient left: with call bell/phone within reach;in chair   PT Visit Diagnosis: Unsteadiness on feet (R26.81);Muscle weakness (generalized) (M62.81)     Time: 1430-1510 PT Time Calculation (min) (ACUTE ONLY): 40 min  Charges:  $Gait Training: 8-22 mins $Therapeutic Exercise: 8-22 mins $Self Care/Home Management: 8-22                        Zena AmosLynn P Aymee Fomby, PT 04/17/2019, 4:04 PM

## 2019-04-17 NOTE — Progress Notes (Signed)
[facility] and AHF Team  ReDS Research Project   Patient #: [id number]  ReDS Measurement  Right: 42%  Left: low quality x3

## 2019-04-18 LAB — COMPREHENSIVE METABOLIC PANEL
ALT: 47 U/L — ABNORMAL HIGH (ref 0–44)
AST: 29 U/L (ref 15–41)
Albumin: 3.1 g/dL — ABNORMAL LOW (ref 3.5–5.0)
Alkaline Phosphatase: 65 U/L (ref 38–126)
Anion gap: 9 (ref 5–15)
BUN: 22 mg/dL — ABNORMAL HIGH (ref 6–20)
CO2: 24 mmol/L (ref 22–32)
Calcium: 8.7 mg/dL — ABNORMAL LOW (ref 8.9–10.3)
Chloride: 105 mmol/L (ref 98–111)
Creatinine, Ser: 0.68 mg/dL (ref 0.44–1.00)
GFR calc Af Amer: >60 mL/min (ref >60)
GFR calc non Af Amer: >60 mL/min (ref >60)
Glucose, Bld: 124 mg/dL — ABNORMAL HIGH (ref 70–99)
Potassium: 4.3 mmol/L (ref 3.5–5.1)
Sodium: 138 mmol/L (ref 135–145)
Total Bilirubin: 0.6 mg/dL (ref 0.3–1.2)
Total Protein: 6.1 g/dL — ABNORMAL LOW (ref 6.5–8.1)

## 2019-04-18 LAB — MAGNESIUM: Magnesium: 2.3 mg/dL (ref 1.7–2.4)

## 2019-04-18 LAB — CBC WITH DIFFERENTIAL/PLATELET
Abs Immature Granulocytes: 0.1 10*3/uL — ABNORMAL HIGH (ref 0.00–0.07)
Basophils Absolute: 0 10*3/uL (ref 0.0–0.1)
Basophils Relative: 0 %
Eosinophils Absolute: 0 10*3/uL (ref 0.0–0.5)
Eosinophils Relative: 0 %
HCT: 40.6 % (ref 36.0–46.0)
Hemoglobin: 13.4 g/dL (ref 12.0–15.0)
Immature Granulocytes: 1 %
Lymphocytes Relative: 11 %
Lymphs Abs: 1 10*3/uL (ref 0.7–4.0)
MCH: 31 pg (ref 26.0–34.0)
MCHC: 33 g/dL (ref 30.0–36.0)
MCV: 94 fL (ref 80.0–100.0)
Monocytes Absolute: 0.4 10*3/uL (ref 0.1–1.0)
Monocytes Relative: 4 %
Neutro Abs: 7.3 10*3/uL (ref 1.7–7.7)
Neutrophils Relative %: 84 %
Platelets: 282 10*3/uL (ref 150–400)
RBC: 4.32 MIL/uL (ref 3.87–5.11)
RDW: 12.7 % (ref 11.5–15.5)
WBC: 8.8 10*3/uL (ref 4.0–10.5)
nRBC: 0 % (ref 0.0–0.2)

## 2019-04-18 LAB — D-DIMER, QUANTITATIVE: D-Dimer, Quant: 0.5 ug/mL-FEU (ref 0.00–0.50)

## 2019-04-18 LAB — BRAIN NATRIURETIC PEPTIDE: B Natriuretic Peptide: 20.3 pg/mL (ref 0.0–100.0)

## 2019-04-18 LAB — FERRITIN: Ferritin: 346 ng/mL — ABNORMAL HIGH (ref 11–307)

## 2019-04-18 LAB — GLUCOSE, CAPILLARY
Glucose-Capillary: 104 mg/dL — ABNORMAL HIGH (ref 70–99)
Glucose-Capillary: 116 mg/dL — ABNORMAL HIGH (ref 70–99)
Glucose-Capillary: 215 mg/dL — ABNORMAL HIGH (ref 70–99)
Glucose-Capillary: 114 mg/dL — ABNORMAL HIGH (ref 70–99)

## 2019-04-18 LAB — LACTATE DEHYDROGENASE: LDH: 319 U/L — ABNORMAL HIGH (ref 98–192)

## 2019-04-18 LAB — C-REACTIVE PROTEIN: CRP: <0.8 mg/dL (ref <1.0)

## 2019-04-18 NOTE — Progress Notes (Signed)
PROGRESS NOTE                                                                                                                                                                                                             Patient Demographics:    Joyce Mcguire, is a 53 y.o. female, DOB - 1966-03-12, MHW:[id number]  Outpatient Primary MD for the patient is System, Pcp Not In    LOS - 11  Admit date - 04/07/2019    Chief Complaint  Patient presents with  . Shortness of Breath       Brief Narrative  Joyce Mcguire is a 53 y.o. F with no significant PMHx who presented with 5 days cough, progressive SOB.  In the ER, SpO2 < 90%, required 4L [place].  CXR showed bilateral opacities, SARS-CoV-2 NAA positive.  Transferred to [facility], intubated on hospital day 2 due to tachypnea, fatigue, escalating O2 requirements. She was given Remdesivir, Actemra and convalescent plasma with improvement.   Subjective:   Patient in bed, appears comfortable, denies any headache, no fever, no chest pain or pressure, no shortness of breath , no abdominal pain. No focal weakness.    Assessment  & Plan :     1. Acute Hypoxic Resp. Failure due to Acute Covid 19 Viral Illness during the ongoing 2020 Covid 19 Pandemic - requiring Intubation and Vent support, received Remdesivir, Actemra, Steroids & Plasma, with some improvement and was eventually extubated, remains on high flow nasal cannula oxygen, CT chest and chest x-ray show extensive interstitial lung injury, encouraged to sit up in chair, added Pulm toiletry through IS + Flutter valve, to new to monitor still quite hypoxic and tenuous.  Now continue IV steroids she has shown mild improvement on 04/18/2019, will try to titrate oxygen down if possible, also family updated on 04/17/2019.   ABG     Component Value Date/Time   PHART 7.356 04/08/2019 1830   PCO2ART 44.1 04/08/2019 1830   PO2ART 211.0 (H) 04/08/2019  1830   HCO3 24.7 04/08/2019 1830   TCO2 26 04/08/2019 1830   ACIDBASEDEF 1.0 04/08/2019 1830   O2SAT 100.0 04/08/2019 1830    COVID-19 Labs  Recent Labs    04/16/19 0358 04/17/19 0400 04/18/19 0300  DDIMER 0.78* 0.89* 0.50  FERRITIN 321* 282 346*  LDH 427* 154 319*  CRP 0.9  2.7* <0.8    Lab Results  Component Value Date   SARSCOV2NAA POSITIVE (A) 04/07/2019     Hepatic Function Latest Ref Rng & Units 04/18/2019 04/17/2019 04/16/2019  Total Protein 6.5 - 8.1 g/dL 6.1(L) 5.4(L) 6.0(L)  Albumin 3.5 - 5.0 g/dL 3.1(L) 2.6(L) 3.1(L)  AST 15 - 41 U/L 29 13(L) 23  ALT 0 - 44 U/L 47(H) 15 44  Alk Phosphatase 38 - 126 U/L 65 54 70  Total Bilirubin 0.3 - 1.2 mg/dL 0.6 0.5 0.4        Component Value Date/Time   BNP 20.3 04/18/2019 0300      2.  Transaminitis -  Due to combination of Covid + Actemra, no pain, LFTs are now normal.  3.  Toxic Encephalopathy - resolved.  4. UTI - pan sensitive Ecoli, on Rocephin x 3 days.   5.  Acute on chronic nonspecific CHF.  No previous echo EF on file.  He has been adequately diuresed.  Currently euvolemic.  Outpatient echocardiogram and cardiology follow-up will be needed post discharge.    6.  Hypokalemia.  Replaced and stable.  7.  ? cavitary lung lesion on chest x-ray.  Stable CT no cavitary lesion.  8.  AKI.  Due to dehydration.  Resolved after IV fluids.    9. Pre DM 2 - A1c 6.3, Lantus + ISS, since steroids are resumed will add pre-meal NovoLog as well.   Lab Results  Component Value Date   HGBA1C 6.3 (H) 04/12/2019    CBG (last 3)  Recent Labs    04/17/19 1640 04/17/19 2018 04/18/19 0733  GLUCAP 143* 229* 104*    Condition - Fair  Family Communication  : Updated daughter on 04/17/2019.  Code Status :  Full  Diet :   Diet Order            Diet Carb Modified Fluid consistency: Thin; Room service appropriate? Yes  Diet effective now               Disposition Plan  :  Home once better  Consults  :  PCCM   Procedures  :  None  PUD Prophylaxis : None  DVT Prophylaxis  :  Lovenox    Lab Results  Component Value Date   PLT 282 04/18/2019    Inpatient Medications  Scheduled Meds: . chlorhexidine gluconate (MEDLINE KIT)  15 mL Mouth Rinse BID  . Chlorhexidine Gluconate Cloth  6 each Topical Q0600  . enoxaparin (LOVENOX) injection  40 mg Subcutaneous Q12H  . feeding supplement (ENSURE ENLIVE)  237 mL Oral TID BM  . insulin aspart  0-15 Units Subcutaneous TID WC  . insulin aspart  3 Units Subcutaneous TID WC  . insulin glargine  12 Units Subcutaneous Daily  . isosorbide mononitrate  30 mg Oral Daily  . methylPREDNISolone (SOLU-MEDROL) injection  60 mg Intravenous Daily  . OLANZapine  2.5 mg Oral QHS  . vitamin C  500 mg Oral Daily  . zinc sulfate  220 mg Oral Daily   Continuous Infusions:  PRN Meds:.acetaminophen, albuterol, ALPRAZolam, guaiFENesin-dextromethorphan, HYDROcodone-acetaminophen, [DISCONTINUED] ondansetron **OR** ondansetron (ZOFRAN) IV, polyethylene glycol  Antibiotics  :    Anti-infectives (From admission, onward)   Start     Dose/Rate Route Frequency Ordered Stop   04/13/19 1000  cefTRIAXone (ROCEPHIN) 1 g in sodium chloride 0.9 % 100 mL IVPB     1 g 200 mL/hr over 30 Minutes Intravenous Every 24 hours 04/13/19 0758 04/15/19 1015  04/08/19 2345  remdesivir 100 mg in sodium chloride 0.9 % 230 mL IVPB     100 mg 500 mL/hr over 30 Minutes Intravenous Every 24 hours 04/07/19 2330 04/11/19 2340   04/07/19 2345  remdesivir 200 mg in sodium chloride 0.9 % 210 mL IVPB     200 mg 500 mL/hr over 30 Minutes Intravenous Once 04/07/19 2330 04/08/19 0037       Time Spent in minutes  30   Joyce Mcguire M.D on 04/18/2019 at 10:53 AM  To page go to [email or web address] - password [email or web address]  [facility] -  Office  [phone]  See all Orders from today for further details    Objective:   Vitals:   04/18/19 0735 04/18/19 0800 04/18/19 0900 04/18/19 1000  BP: 112/73      Pulse: 88 (!) 102 (!) 109 100  Resp: 20 (!) 22 (!) 28 (!) 29  Temp: 98.2 F (36.8 C)     TempSrc: Oral     SpO2: 96% 90% 96% 97%  Weight:      Height:        Wt Readings from Last 3 Encounters:  04/17/19 64.9 kg     Intake/Output Summary (Last 24 hours) at 04/18/2019 1053 Last data filed at 04/18/2019 0900 Gross per 24 hour  Intake 1684.79 ml  Output 450 ml  Net 1234.79 ml     Physical Exam  Awake Alert, Oriented X 3, No new F.N deficits, Normal affect [place].AT,PERRAL Supple Neck,No JVD, No cervical lymphadenopathy appriciated.  Symmetrical Chest wall movement, Good air movement bilaterally, CTAB RRR,No Gallops, Rubs or new Murmurs, No Parasternal Heave +ve B.Sounds, Abd Soft, No tenderness, No organomegaly appriciated, No rebound - guarding or rigidity. No Cyanosis, Clubbing or edema, No new Rash or bruise    Data Review:    CBC Recent Labs  Lab 04/13/19 0544 04/15/19 0341 04/16/19 0358 04/17/19 0400 04/18/19 0300  WBC 8.6 7.1 8.1 7.0 8.8  HGB 14.1 13.7 14.5 8.2* 13.4  HCT 42.8 41.8 45.3 25.7* 40.6  PLT 388 333 287 205 282  MCV 95.5 94.8 95.4 71.6* 94.0  MCH 31.5 31.1 30.5 22.8* 31.0  MCHC 32.9 32.8 32.0 31.9 33.0  RDW 12.9 12.7 13.0 14.3 12.7  LYMPHSABS 0.8 1.0 0.9 1.0 1.0  MONOABS 0.3 0.3 0.4 0.6 0.4  EOSABS 0.1 0.2 0.2 0.1 0.0  BASOSABS 0.0 0.0 0.0 0.0 0.0    Chemistries  Recent Labs  Lab 04/13/19 0544  04/14/19 0734 04/15/19 0341 04/16/19 0358 04/17/19 0400 04/18/19 0300  NA 148*  --  145 144 144 142 138  K 3.3*  --  3.7 3.3* 4.4 3.6 4.3  CL 112*  --  110 112* 113* 111 105  CO2 28  --  26 26 22 22 24   GLUCOSE 195*  --  105* 94 99 84 124*  BUN 21*  --  17 16 19  31* 22*  CREATININE 0.75   < > 0.71 0.68 0.75 1.70* 0.68  CALCIUM 8.6*  --  8.4* 8.2* 8.7* 8.2* 8.7*  MG  --   --  2.3 2.4 2.4 2.1 2.3  AST 59*  --   --  22 23 13* 29  ALT 121*  --   --  55* 44 15 47*  ALKPHOS 68  --   --  66 70 54 65  BILITOT 0.7  --   --  0.2* 0.4 0.5 0.6   <  > = values in this interval not displayed.   ------------------------------------------------------------------------------------------------------------------  No results for input(s): CHOL, HDL, LDLCALC, TRIG, CHOLHDL, LDLDIRECT in the last 72 hours.  Lab Results  Component Value Date   HGBA1C 6.3 (H) 04/12/2019   ------------------------------------------------------------------------------------------------------------------ No results for input(s): TSH, T4TOTAL, T3FREE, THYROIDAB in the last 72 hours.  Invalid input(s): FREET3  Cardiac Enzymes No results for input(s): CKMB, TROPONINI, MYOGLOBIN in the last 168 hours.  Invalid input(s): CK ------------------------------------------------------------------------------------------------------------------    Component Value Date/Time   BNP 20.3 04/18/2019 0300    Micro Results No results found for this or any previous visit (from the past 240 hour(s)).  Radiology Reports Dg Abd 1 View  Result Date: 04/08/2019 CLINICAL DATA:  OG tube placement. EXAM: ABDOMEN - 1 VIEW COMPARISON:  None. FINDINGS: OG tube is in place with the tip in the distal stomach or first portion of the duodenum. IMPRESSION: As above. Electronically Signed   By: Inge Rise M.D.   On: 04/08/2019 15:05   Ct Chest Wo Contrast  Result Date: 04/15/2019 CLINICAL DATA:  Possible cavitary lesion on recent chest x-ray EXAM: CT CHEST WITHOUT CONTRAST TECHNIQUE: Multidetector CT imaging of the chest was performed following the standard protocol without IV contrast. COMPARISON:  04/15/2019 FINDINGS: Cardiovascular: Somewhat limited due to lack of IV contrast. No significant atherosclerotic calcifications are seen. No aneurysmal dilatation or dissection is noted. No cardiac enlargement is seen. At the confluence of the right internal jugular vein and right subclavian vein there is a rounded lipomatous lesion identified which likely sits adjacent to the confluence as  opposed to actually being within the vein. Mediastinum/Nodes: Thoracic inlet is within normal limits. Few scattered small reactive lymph nodes are identified within the hila and mediastinum. The esophagus is within normal limits. Lungs/Pleura: Lungs again demonstrate multifocal confluent infiltrates throughout both lungs similar to that seen on recent chest x-ray consistent with the given clinical history of COVID-19 positivity. No sizable effusion is noted. No pneumothorax is seen. The area of lucency noted on recent chest x-ray represents an area of spared lung as opposed to a true cavitary lesion. Upper Abdomen: Visualized upper abdomen is within normal limits. Musculoskeletal: No chest wall mass or suspicious bone lesions identified. IMPRESSION: Multifocal confluent infiltrates throughout both lungs consistent with the patient's given clinical history of COVID-19 positivity. The area of lucency seen on recent chest x-ray represents an area of spared lung on the right not a true cavitary lesion. No other focal abnormality is noted. Electronically Signed   By: Inez Catalina M.D.   On: 04/15/2019 15:03   Dg Chest Port 1 View  Result Date: 04/17/2019 CLINICAL DATA:  Shortness of breath. EXAM: PORTABLE CHEST 1 VIEW COMPARISON:  One-view chest x-ray 04/15/2019.  CT chest 04/15/2019 FINDINGS: Patchy bilateral airspace opacities are again noted. There is slight increase and upper lobe consolidation bilaterally. Lung volumes remain low. Visualized soft tissues and bony thorax are unremarkable. IMPRESSION: 1. Stable to slight increase in patchy bilateral airspace disease compatible with multi lobar COVID-19 pneumonia. Electronically Signed   By: San Morelle M.D.   On: 04/17/2019 08:25   Dg Chest Port 1 View  Result Date: 04/15/2019 CLINICAL DATA:  Shortness of breath, COVID-19 EXAM: PORTABLE CHEST 1 VIEW COMPARISON:  Portable exam 0940 hours compared to 04/08/2019 FINDINGS: Normal heart size, mediastinal  contours, and pulmonary vascularity. Patchy BILATERAL pulmonary infiltrates consistent with pneumonia. Area of relative lucency in the RIGHT mid lung may represent spared parenchyma though attention on follow-up imaging recommended to exclude developing cavitation. No pleural effusion or pneumothorax.  Bones unremarkable. IMPRESSION: Patchy BILATERAL pulmonary infiltrates consistent with pneumonia. Lucency in the RIGHT mid lung, likely spared lung, but recommend attention on follow-up exams to exclude developing cavitation. Electronically Signed   By: Lavonia Dana M.D.   On: 04/15/2019 10:02   Dg Chest Port 1 View  Result Date: 04/08/2019 CLINICAL DATA:  Multifocal pneumonia.  COVID Positive. EXAM: PORTABLE CHEST 1 VIEW COMPARISON:  04/07/2019 FINDINGS: Endotracheal tube tip is 11 mm above the carina. NG tube tip is in the distal stomach or duodenal bulb. The patient has extensive bilateral hazy pulmonary infiltrates which appear less prominent than on the prior study but the aeration is improved in both lungs as compared to the prior exam. Heart size and vascularity are normal. No effusions. Bones are normal. IMPRESSION: 1. Extensive bilateral pulmonary infiltrates consistent with the patient's history of COVID-19. 2. Endotracheal tube is 11 mm above the carina and could be slightly retracted. Electronically Signed   By: Lorriane Shire M.D.   On: 04/08/2019 15:06   Dg Chest Port 1 View  Result Date: 04/07/2019 CLINICAL DATA:  Shortness of breath since last Friday, cough, chest pain, history asthma, reported COVID-19 positive test at another hospital EXAM: PORTABLE CHEST 1 VIEW COMPARISON:  Portable exam 1455 hours without priors for comparison FINDINGS: Upper normal heart size. Normal mediastinal contours. Patchy BILATERAL pulmonary infiltrates likely infectious. No definite pleural effusion or pneumothorax. Osseous structures unremarkable. IMPRESSION: Patchy BILATERAL pulmonary infiltrates consistent with  pneumonia, including atypical etiologies. Electronically Signed   By: Lavonia Dana M.D.   On: 04/07/2019 15:34

## 2019-04-18 NOTE — Progress Notes (Signed)
Occupational Therapy Treatment Patient Details Name: Joyce Mcguire MRN: [id number] DOB: May 03, 1966 Today's Date: 04/18/2019    History of present illness 53 y.o.F with no significant PMHx who presented with 5 days cough, progressive SOB.  In the ER, SpO2 <90%, required 4L [place]. SARS-CoV-2 NAA positive. Transferred to [place], intubated 04/08/19 due to tachypnea, fatigue, escalating O2 requirements. She was givenRemdesivir, Actemra and convalescent plasma with improvement. Extubated 04/10/19   OT comments  Pt progressing towards OT goals. Just recently returned from walking in hallway with PT. Pt was able to ambulate to sink to perform standing grooming at min guard/supervision level, then able to perform therband exercise regiment as noted below. She took several rest breaks during theraband HEP. VSS throughout on 4L O2 where O2 remained >90%. Pt eating lunch at the end of session in recliner. OT will continue to follow acutely.    Follow Up Recommendations  Home health OT;Supervision/Assistance - 24 hour    Equipment Recommendations  3 in 1 bedside commode    Recommendations for Other Services      Precautions / Restrictions Precautions Precautions: Fall Restrictions Weight Bearing Restrictions: No       Mobility Bed Mobility               General bed mobility comments: in recliner at beginning and end of session  Transfers Overall transfer level: Needs assistance Equipment used: None Transfers: Sit to/from Stand Sit to Stand: Supervision              Balance Overall balance assessment: Needs assistance   Sitting balance-Leahy Scale: Good     Standing balance support: No upper extremity supported;During functional activity Standing balance-Leahy Scale: Fair                             ADL either performed or assessed with clinical judgement   ADL Overall ADL's : Needs assistance/impaired Eating/Feeding: Set up;Sitting Eating/Feeding Details  (indicate cue type and reason): in recliner at end of session Grooming: Wash/dry hands;Min guard;Standing Grooming Details (indicate cue type and reason): prior to eating                 Toilet Transfer: Min guard;Ambulation;Minimal assistance Toilet Transfer Details (indicate cue type and reason): reaches out for support from furniture or therapist, but largely min guard Toileting- Clothing Manipulation and Hygiene: Supervision/safety;Sit to/from stand       Functional mobility during ADLs: Min guard;Minimal assistance General ADL Comments: Pt just completed hallway ambulation with PT     Vision       Perception     Praxis      Cognition Arousal/Alertness: Awake/alert Behavior During Therapy: WFL for tasks assessed/performed Overall Cognitive Status: Within Functional Limits for tasks assessed                                          Exercises Exercises: Other exercises Other Exercises Other Exercises: Seated UE exercises twith level 1 band. Patient required visual demonstration (in addition to the pictures/handout) with ultimately able to complete 4 exercises correctly 5-10 reps with rest periods between (scapular adduction, PNF pattern, elbow flexion, elbow extension)   Shoulder Instructions       General Comments      Pertinent Vitals/ Pain       Pain Assessment: No/denies pain  Home Living  Prior Functioning/Environment              Frequency  Min 3X/week        Progress Toward Goals  OT Goals(current goals can now be found in the care plan section)  Progress towards OT goals: Progressing toward goals  Acute Rehab OT Goals Patient Stated Goal: get well and go home soon OT Goal Formulation: With patient Time For Goal Achievement: 04/29/19 Potential to Achieve Goals: Good  Plan Discharge plan remains appropriate    Co-evaluation                 AM-PAC OT  "6 Clicks" Daily Activity     Outcome Measure   Help from another person eating meals?: None Help from another person taking care of personal grooming?: A Little Help from another person toileting, which includes using toliet, bedpan, or urinal?: A Little Help from another person bathing (including washing, rinsing, drying)?: A Lot Help from another person to put on and taking off regular upper body clothing?: A Little Help from another person to put on and taking off regular lower body clothing?: A Little 6 Click Score: 18    End of Session Equipment Utilized During Treatment: Oxygen(4L)  OT Visit Diagnosis: Unsteadiness on feet (R26.81);Other abnormalities of gait and mobility (R26.89);Muscle weakness (generalized) (M62.81)   Activity Tolerance Patient tolerated treatment well   Patient Left in chair;with call bell/phone within reach;with chair alarm set   Nurse Communication Mobility status        Time: 1245-1310 OT Time Calculation (min): 25 min  Charges: OT General Charges $OT Visit: 1 Visit OT Treatments $Self Care/Home Management : 8-22 mins $Therapeutic Exercise: 8-22 mins  Sherryl MangesLaura Breandan People OTR/L Acute Rehabilitation Services Pager: [phone] Office: [phone]  Evern BioLaura J Tavion Senkbeil 04/18/2019, 5:40 PM

## 2019-04-18 NOTE — Plan of Care (Signed)

## 2019-04-18 NOTE — Progress Notes (Signed)
Physical Therapy Treatment Patient Details Name: Joyce Mcguire MRN: [id number] DOB: 1965-11-20 Today's Date: 04/18/2019    History of Present Illness 53 y.o.F with no significant PMHx who presented with 5 days cough, progressive SOB.  In the ER, SpO2 <90%, required 4L [place]. SARS-CoV-2 NAA positive. Transferred to [facility], intubated 04/08/19 due to tachypnea, fatigue, escalating O2 requirements. She was givenRemdesivir, Actemra and convalescent plasma with improvement. Extubated 04/10/19    PT Comments    Patient with much improved tolerance for activity! Walked 120 ft on 4L with SaO2 90-93% HR 129 (with several standing rest breaks). Slightly unsteady and pt with good awareness and stops when she needs to recover. Patient eager to go home and again explained importance of remaining in hospital until MD feels she is safe to go home.     Follow Up Recommendations  Home health PT;Supervision/Assistance - 24 hour(will continue to assess HH needs)     Equipment Recommendations  None recommended by PT    Recommendations for Other Services       Precautions / Restrictions Precautions Precautions: Fall    Mobility  Bed Mobility                  Transfers Overall transfer level: Needs assistance Equipment used: None Transfers: Sit to/from Stand Sit to Stand: Supervision         General transfer comment: supervision for lines/safety; no imbalance  Ambulation/Gait Ambulation/Gait assistance: Min guard;Min assist Gait Distance (Feet): 120 Feet Assistive device: None Gait Pattern/deviations: Step-through pattern;Decreased step length - right;Decreased step length - left     General Gait Details: slight drift, stagger with assist at her waist; on 4L O2 with HR max 129 (at rest 110) with SaO2 90-95%    Stairs             Wheelchair Mobility    Modified Rankin (Stroke Patients Only)       Balance Overall balance assessment: Needs assistance         Standing  balance support: No upper extremity supported;During functional activity Standing balance-Leahy Scale: Fair                              Cognition Arousal/Alertness: Awake/alert Behavior During Therapy: WFL for tasks assessed/performed Overall Cognitive Status: Within Functional Limits for tasks assessed                                        Exercises      General Comments        Pertinent Vitals/Pain      Home Living                      Prior Function            PT Goals (current goals can now be found in the care plan section) Acute Rehab PT Goals Patient Stated Goal: get well and go home soon Time For Goal Achievement: 04/28/19    Frequency    Min 3X/week      PT Plan Current plan remains appropriate    Co-evaluation              AM-PAC PT "6 Clicks" Mobility   Outcome Measure  Help needed turning from your back to your side while in a flat bed without using bedrails?: None Help  needed moving from lying on your back to sitting on the side of a flat bed without using bedrails?: None Help needed moving to and from a bed to a chair (including a wheelchair)?: A Little Help needed standing up from a chair using your arms (e.g., wheelchair or bedside chair)?: A Little Help needed to walk in hospital room?: A Little Help needed climbing 3-5 steps with a railing? : A Lot 6 Click Score: 19    End of Session Equipment Utilized During Treatment: Oxygen Activity Tolerance: Patient tolerated treatment well Patient left: with call bell/phone within reach;in chair   PT Visit Diagnosis: Unsteadiness on feet (R26.81);Muscle weakness (generalized) (M62.81)     Time: 1771-1657 PT Time Calculation (min) (ACUTE ONLY): 22 min  Charges:  $Gait Training: 8-22 mins                        [employer], PT 04/18/2019, 2:19 PM

## 2019-04-18 NOTE — Progress Notes (Signed)
[facility] and AHF Team  ReDS Research Project   Patient #: [id number]  ReDS Measurement  Right: low quality   Left: 44%

## 2019-04-19 LAB — GLUCOSE, CAPILLARY
Glucose-Capillary: 79 mg/dL (ref 70–99)
Glucose-Capillary: 129 mg/dL — ABNORMAL HIGH (ref 70–99)
Glucose-Capillary: 291 mg/dL — ABNORMAL HIGH (ref 70–99)
Glucose-Capillary: 108 mg/dL — ABNORMAL HIGH (ref 70–99)

## 2019-04-19 LAB — LACTATE DEHYDROGENASE: LDH: 278 U/L — ABNORMAL HIGH (ref 98–192)

## 2019-04-19 MED ORDER — DICLOFENAC SODIUM 1 % TD GEL
2.0000 g | Freq: Two times a day (BID) | TRANSDERMAL | Status: DC | PRN
  Filled 2019-04-19: qty 100

## 2019-04-19 MED ORDER — METHYLPREDNISOLONE SODIUM SUCC 40 MG IJ SOLR
40.0000 mg | Freq: Every day | INTRAMUSCULAR | Status: DC
  Administered 2019-04-20: 40 mg via INTRAVENOUS
  Filled 2019-04-19: qty 1

## 2019-04-19 MED ORDER — HYDROCOD POLST-CPM POLST ER 10-8 MG/5ML PO SUER
5.0000 mL | Freq: Two times a day (BID) | ORAL | Status: DC
  Administered 2019-04-19 – 2019-04-20 (×3): 5 mL via ORAL
  Filled 2019-04-19 (×3): qty 5

## 2019-04-19 MED ORDER — INSULIN GLARGINE 100 UNIT/ML ~~LOC~~ SOLN
8.0000 [IU] | Freq: Every day | SUBCUTANEOUS | Status: DC
  Administered 2019-04-20: 8 [IU] via SUBCUTANEOUS
  Filled 2019-04-19: qty 0.08

## 2019-04-19 NOTE — Progress Notes (Signed)
SATURATION QUALIFICATIONS: (This note is used to comply with regulatory documentation for home oxygen)  Patient Saturations on Room Air at Rest = 92%  Patient Saturations on Room Air while Ambulating = 87%  Patient Saturations on 2 Liters of oxygen while Ambulating = 93%  Please briefly explain why patient needs home oxygen:Unable to maintain adequate oxygenation with activity without supplemental O2.  [place] Pager [phone] Office [phone]

## 2019-04-19 NOTE — Progress Notes (Addendum)
Physical Therapy Treatment Patient Details Name: Joyce Mcguire MRN: [id number] DOB: 1966/05/31 Today's Date: 04/19/2019    History of Present Illness 53 y.o.F with no significant PMHx who presented with 5 days cough, progressive SOB.  In the ER, SpO2 <90%, required 4L [place]. SARS-CoV-2 NAA positive. Transferred to [place], intubated 04/08/19 due to tachypnea, fatigue, escalating O2 requirements. She was givenRemdesivir, Actemra and convalescent plasma with improvement. Extubated 04/10/19    PT Comments    Pt seen a second time to ambulate and evaluate if she could mobilize on RA. Pt with SpO2 87% on RA with amb and HR 130's. O2 replaced. Talked to pt about frequent short bouts of activity when she goes home to build activity tolerance.   Follow Up Recommendations  Home health PT;Supervision - Intermittent     Equipment Recommendations  None recommended by PT    Recommendations for Other Services       Precautions / Restrictions Precautions Precautions: Fall    Mobility  Bed Mobility               General bed mobility comments: Up in recliner  Transfers Overall transfer level: Modified independent Equipment used: None Transfers: Sit to/from Stand Sit to Stand: Modified independent (Device/Increase time)            Ambulation/Gait Ambulation/Gait assistance: Supervision Gait Distance (Feet): 250 Feet Assistive device: None Gait Pattern/deviations: Step-through pattern;Drifts right/left   Gait velocity interpretation: >2.62 ft/sec, indicative of community ambulatory General Gait Details: Slight drift but no LOB. Pt amb on RA with SpO2 87%   Stairs             Wheelchair Mobility    Modified Rankin (Stroke Patients Only)       Balance Overall balance assessment: Needs assistance Sitting-balance support: No upper extremity supported Sitting balance-Leahy Scale: Normal     Standing balance support: No upper extremity supported;During functional  activity Standing balance-Leahy Scale: Good                              Cognition Arousal/Alertness: Awake/alert Behavior During Therapy: WFL for tasks assessed/performed Overall Cognitive Status: Within Functional Limits for tasks assessed                                        Exercises      General Comments        Pertinent Vitals/Pain Pain Assessment: No/denies pain    Home Living                      Prior Function            PT Goals (current goals can now be found in the care plan section) Acute Rehab PT Goals Patient Stated Goal: go home PT Goal Formulation: With patient Time For Goal Achievement: 04/28/19 Potential to Achieve Goals: Good Progress towards PT goals: Progressing toward goals    Frequency    Min 3X/week      PT Plan Current plan remains appropriate    Co-evaluation              AM-PAC PT "6 Clicks" Mobility   Outcome Measure  Help needed turning from your back to your side while in a flat bed without using bedrails?: None Help needed moving from lying on your back to sitting on  the side of a flat bed without using bedrails?: None Help needed moving to and from a bed to a chair (including a wheelchair)?: None Help needed standing up from a chair using your arms (e.g., wheelchair or bedside chair)?: None Help needed to walk in hospital room?: A Little Help needed climbing 3-5 steps with a railing? : A Little 6 Click Score: 22    End of Session Equipment Utilized During Treatment: Oxygen Activity Tolerance: Patient tolerated treatment well Patient left: in chair;with call bell/phone within reach   PT Visit Diagnosis: Unsteadiness on feet (R26.81);Muscle weakness (generalized) (M62.81)     Time: 1413-1422 PT Time Calculation (min) (ACUTE ONLY): 9 min  Charges:  $Gait Training: 8-22 mins  4098-1191                   [facility] Center[facility] Adalynd Mcguire PT Acute Rehabilitation Services Pager [phone] Office  [phone]    Joyce Mcguire 04/19/2019, 3:19 PM

## 2019-04-19 NOTE — Progress Notes (Signed)
[facility] Research Department and AHF Team  ReDS Research Project   Patient #: [id number]  ReDS Measurement  Right: 33 %  Left: 36 %

## 2019-04-19 NOTE — Progress Notes (Addendum)
Physical Therapy Treatment Patient Details Name: Joyce Mcguire MRN: [id number] DOB: 1965/12/12 Today's Date: 04/19/2019    History of Present Illness 53 y.o.F with no significant PMHx who presented with 5 days cough, progressive SOB.  In the ER, SpO2 <90%, required 4L [place]. SARS-CoV-2 NAA positive. Transferred to [place], intubated 04/08/19 due to tachypnea, fatigue, escalating O2 requirements. She was givenRemdesivir, Actemra and convalescent plasma with improvement. Extubated 04/10/19    PT Comments    Pt making good progress. Amb on 2L of O2 with SpO2 93% and HR 130's.   Follow Up Recommendations  Home health PT;Supervision - Intermittent     Equipment Recommendations  None recommended by PT    Recommendations for Other Services       Precautions / Restrictions Precautions Precautions: Fall    Mobility  Bed Mobility               General bed mobility comments: Up in recliner  Transfers Overall transfer level: Modified independent Equipment used: None Transfers: Sit to/from Stand Sit to Stand: Modified independent (Device/Increase time)            Ambulation/Gait Ambulation/Gait assistance: Supervision Gait Distance (Feet): 300 Feet Assistive device: None Gait Pattern/deviations: Step-through pattern;Drifts right/left   Gait velocity interpretation: >2.62 ft/sec, indicative of community ambulatory General Gait Details: Slight drift but no LOB. Pt amb on 2L with SpO2 93%.    Stairs             Wheelchair Mobility    Modified Rankin (Stroke Patients Only)       Balance Overall balance assessment: Needs assistance Sitting-balance support: No upper extremity supported Sitting balance-Leahy Scale: Normal     Standing balance support: No upper extremity supported;During functional activity Standing balance-Leahy Scale: Good                              Cognition Arousal/Alertness: Awake/alert Behavior During Therapy: WFL for tasks  assessed/performed Overall Cognitive Status: Within Functional Limits for tasks assessed                                        Exercises      General Comments        Pertinent Vitals/Pain Pain Assessment: No/denies pain    Home Living                      Prior Function            PT Goals (current goals can now be found in the care plan section) Acute Rehab PT Goals Patient Stated Goal: go home PT Goal Formulation: With patient Time For Goal Achievement: 04/28/19 Potential to Achieve Goals: Good Progress towards PT goals: Goals met and updated - see care plan    Frequency    Min 3X/week      PT Plan Current plan remains appropriate    Co-evaluation              AM-PAC PT "6 Clicks" Mobility   Outcome Measure  Help needed turning from your back to your side while in a flat bed without using bedrails?: None Help needed moving from lying on your back to sitting on the side of a flat bed without using bedrails?: None Help needed moving to and from a bed to a chair (including a wheelchair)?:  None Help needed standing up from a chair using your arms (e.g., wheelchair or bedside chair)?: None Help needed to walk in hospital room?: A Little Help needed climbing 3-5 steps with a railing? : A Little 6 Click Score: 22    End of Session Equipment Utilized During Treatment: Oxygen Activity Tolerance: Patient tolerated treatment well Patient left: in chair;with call bell/phone within reach   PT Visit Diagnosis: Unsteadiness on feet (R26.81);Muscle weakness (generalized) (M62.81)     Time: 3817-7116 PT Time Calculation (min) (ACUTE ONLY): 14 min  Charges:  $Gait Training: 8-22 mins                     [place] Pager [phone] Office [place] 04/19/2019, 3:10 PM

## 2019-04-19 NOTE — Plan of Care (Signed)

## 2019-04-19 NOTE — Progress Notes (Signed)
PROGRESS NOTE                                                                                                                                                                                                             Patient Demographics:    Joyce Mcguire, is a 53 y.o. female, DOB - 1966-05-31, AUQ:[id number]  Outpatient Primary MD for the patient is System, Pcp Not In    LOS - 12  Admit date - 04/07/2019    Chief Complaint  Patient presents with   Shortness of Breath       Brief Narrative  Joyce Mcguire is a 53 y.o. F with no significant PMHx who presented with 5 days cough, progressive SOB.  In the ER, SpO2 < 90%, required 4L [place].  CXR showed bilateral opacities, SARS-CoV-2 NAA positive.  Transferred to [place], intubated on hospital day 2 due to tachypnea, fatigue, escalating O2 requirements. She was given Remdesivir, Actemra and convalescent plasma with improvement.   Subjective:   Patient in bed, appears comfortable, denies any headache, no fever, no chest pain or pressure, no shortness of breath , no abdominal pain. No focal weakness.  She has mild upper back pain from coughing.   Assessment  & Plan :     1. Acute Hypoxic Resp. Failure due to Acute Covid 19 Viral Illness during the ongoing 2020 Covid 19 Pandemic - requiring Intubation and Vent support, received Remdesivir, Actemra, Steroids & Plasma, with some improvement and was eventually extubated, remains on high flow nasal cannula oxygen, CT chest and chest x-ray show extensive interstitial lung injury, encouraged to sit up in chair, added Pulm toiletry through IS + Flutter valve, to new to monitor still quite hypoxic and tenuous.  Is now on IV steroids with gradual but definite clinical improvement, much better on 04/19/2019, start tapering steroids, increase activity and prepare for discharge in the next 1 to 2 days.   ABG     Component Value Date/Time   PHART  7.356 04/08/2019 1830   PCO2ART 44.1 04/08/2019 1830   PO2ART 211.0 (H) 04/08/2019 1830   HCO3 24.7 04/08/2019 1830   TCO2 26 04/08/2019 1830   ACIDBASEDEF 1.0 04/08/2019 1830   O2SAT 100.0 04/08/2019 1830    COVID-19 Labs  Recent Labs    04/17/19 0400 04/18/19 0300 04/19/19 0400  DDIMER QUESTIONABLE  RESULTS, RECOMMEND RECOLLECT TO VERIFY 0.50  --   FERRITIN 282 346*  --   LDH QUESTIONABLE RESULTS, RECOMMEND RECOLLECT TO VERIFY 319* 278*  CRP 2.7* <0.8  --     Lab Results  Component Value Date   SARSCOV2NAA POSITIVE (A) 04/07/2019       Component Value Date/Time   BNP 20.3 04/18/2019 0300   Hepatic Function Panel     Component Value Date/Time   PROT 6.1 (L) 04/18/2019 0300   ALBUMIN 3.1 (L) 04/18/2019 0300   AST 29 04/18/2019 0300   ALT 47 (H) 04/18/2019 0300   ALKPHOS 65 04/18/2019 0300   BILITOT 0.6 04/18/2019 0300     2.  Transaminitis -  Due to combination of Covid + Actemra, no pain, LFTs are now close to normal.  3.  Toxic Encephalopathy - resolved.  4. UTI - pan sensitive Ecoli, she was treated with Rocephin x 3 days.   5.  Acute on chronic nonspecific CHF.  No previous echo EF on file.  He has been adequately diuresed.  Currently euvolemic.  Outpatient echocardiogram and cardiology follow-up will be needed post discharge.    6.  Hypokalemia.  Replaced and stable.  7.  ? cavitary lung lesion on chest x-ray.  Stable CT no cavitary lesion.  8.  AKI.  Due to dehydration.  Resolved after IV fluids.    9.  Musculoskeletal upper back pain.  Voltaren gel.    10. Pre DM 2 - A1c 6.3, Lantus + ISS, since steroids are resumed will add pre-meal NovoLog as well.  Lantus dose dropped on 04/19/2019 as steroids being tapered now.   Lab Results  Component Value Date   HGBA1C 6.3 (H) 04/12/2019    CBG (last 3)  Recent Labs    04/18/19 1617 04/18/19 2058 04/19/19 0756  GLUCAP 215* 114* 79    Condition - Fair  Family Communication  : Updated daughter on  04/17/2019.  Code Status :  Full  Diet :   Diet Order            Diet Carb Modified Fluid consistency: Thin; Room service appropriate? Yes  Diet effective now               Disposition Plan  :  Home once better  Consults  :  PCCM  Procedures  :  None  PUD Prophylaxis : None  DVT Prophylaxis  :  Lovenox    Lab Results  Component Value Date   PLT 282 04/18/2019    Inpatient Medications  Scheduled Meds:  chlorhexidine gluconate (MEDLINE KIT)  15 mL Mouth Rinse BID   Chlorhexidine Gluconate Cloth  6 each Topical Q0600   enoxaparin (LOVENOX) injection  40 mg Subcutaneous Q12H   feeding supplement (ENSURE ENLIVE)  237 mL Oral TID BM   insulin aspart  0-15 Units Subcutaneous TID WC   insulin aspart  3 Units Subcutaneous TID WC   insulin glargine  12 Units Subcutaneous Daily   isosorbide mononitrate  30 mg Oral Daily   methylPREDNISolone (SOLU-MEDROL) injection  60 mg Intravenous Daily   OLANZapine  2.5 mg Oral QHS   vitamin C  500 mg Oral Daily   zinc sulfate  220 mg Oral Daily   Continuous Infusions:  PRN Meds:.acetaminophen, albuterol, ALPRAZolam, diclofenac sodium, guaiFENesin-dextromethorphan, HYDROcodone-acetaminophen, [DISCONTINUED] ondansetron **OR** ondansetron (ZOFRAN) IV, polyethylene glycol  Antibiotics  :    Anti-infectives (From admission, onward)   Start     Dose/Rate Route Frequency Ordered  Stop   04/13/19 1000  cefTRIAXone (ROCEPHIN) 1 g in sodium chloride 0.9 % 100 mL IVPB     1 g 200 mL/hr over 30 Minutes Intravenous Every 24 hours 04/13/19 0758 04/15/19 1015   04/08/19 2345  remdesivir 100 mg in sodium chloride 0.9 % 230 mL IVPB     100 mg 500 mL/hr over 30 Minutes Intravenous Every 24 hours 04/07/19 2330 04/11/19 2340   04/07/19 2345  remdesivir 200 mg in sodium chloride 0.9 % 210 mL IVPB     200 mg 500 mL/hr over 30 Minutes Intravenous Once 04/07/19 2330 04/08/19 0037       Time Spent in minutes  30   Lala Lund M.D on  04/19/2019 at 9:44 AM  To page go to [email or web address] - password [facility]  [facility] -  Office  [phone]  See all Orders from today for further details    Objective:   Vitals:   04/18/19 2000 04/19/19 0040 04/19/19 0541 04/19/19 0757  BP: 123/86 108/67 106/67 105/79  Pulse: (!) 121 90 86 98  Resp: (!) 23   20  Temp: 97.8 F (36.6 C) 98.1 F (36.7 C) 97.8 F (36.6 C) 98 F (36.7 C)  TempSrc: Oral Oral Oral Oral  SpO2: 97% 99% 99% 96%  Weight:      Height:        Wt Readings from Last 3 Encounters:  04/17/19 64.9 kg     Intake/Output Summary (Last 24 hours) at 04/19/2019 0944 Last data filed at 04/19/2019 0542 Gross per 24 hour  Intake 360 ml  Output 150 ml  Net 210 ml     Physical Exam  Awake Alert, Oriented X 3, No new F.N deficits, Normal affect [place].AT,PERRAL Supple Neck,No JVD, No cervical lymphadenopathy appriciated.  Symmetrical Chest wall movement, Good air movement bilaterally, CTAB RRR,No Gallops, Rubs or new Murmurs, No Parasternal Heave +ve B.Sounds, Abd Soft, No tenderness, No organomegaly appriciated, No rebound - guarding or rigidity. No Cyanosis, Clubbing or edema, No new Rash or bruise    Data Review:    CBC Recent Labs  Lab 04/13/19 0544 04/15/19 0341 04/16/19 0358 04/17/19 0400 04/18/19 0300  WBC 8.6 7.1 8.1 QUESTIONABLE RESULTS, RECOMMEND RECOLLECT TO VERIFY 8.8  HGB 14.1 13.7 14.5 QUESTIONABLE RESULTS, RECOMMEND RECOLLECT TO VERIFY 13.4  HCT 42.8 41.8 45.3 QUESTIONABLE RESULTS, RECOMMEND RECOLLECT TO VERIFY 40.6  PLT 388 333 287 QUESTIONABLE RESULTS, RECOMMEND RECOLLECT TO VERIFY 282  MCV 95.5 94.8 95.4 QUESTIONABLE RESULTS, RECOMMEND RECOLLECT TO VERIFY 94.0  MCH 31.5 31.1 30.5 QUESTIONABLE RESULTS, RECOMMEND RECOLLECT TO VERIFY 31.0  MCHC 32.9 32.8 32.0 QUESTIONABLE RESULTS, RECOMMEND RECOLLECT TO VERIFY 33.0  RDW 12.9 12.7 13.0 QUESTIONABLE RESULTS, RECOMMEND RECOLLECT TO VERIFY 12.7  LYMPHSABS 0.8 1.0 0.9 QUESTIONABLE  RESULTS, RECOMMEND RECOLLECT TO VERIFY 1.0  MONOABS 0.3 0.3 0.4 QUESTIONABLE RESULTS, RECOMMEND RECOLLECT TO VERIFY 0.4  EOSABS 0.1 0.2 0.2 QUESTIONABLE RESULTS, RECOMMEND RECOLLECT TO VERIFY 0.0  BASOSABS 0.0 0.0 0.0 QUESTIONABLE RESULTS, RECOMMEND RECOLLECT TO VERIFY 0.0    Chemistries  Recent Labs  Lab 04/13/19 0544  04/14/19 0734 04/15/19 0341 04/16/19 0358 04/17/19 0400 04/18/19 0300  NA 148*  --  145 144 144 QUESTIONABLE RESULTS, RECOMMEND RECOLLECT TO VERIFY 138  K 3.3*  --  3.7 3.3* 4.4 QUESTIONABLE RESULTS, RECOMMEND RECOLLECT TO VERIFY 4.3  CL 112*  --  110 112* 113* QUESTIONABLE RESULTS, RECOMMEND RECOLLECT TO VERIFY 105  CO2 28  --  26 26 22  QUESTIONABLE RESULTS, RECOMMEND  RECOLLECT TO VERIFY 24  GLUCOSE 195*  --  105* 94 99 QUESTIONABLE RESULTS, RECOMMEND RECOLLECT TO VERIFY 124*  BUN 21*  --  17 16 19  QUESTIONABLE RESULTS, RECOMMEND RECOLLECT TO VERIFY 22*  CREATININE 0.75   < > 0.71 0.68 0.75 QUESTIONABLE RESULTS, RECOMMEND RECOLLECT TO VERIFY 0.68  CALCIUM 8.6*  --  8.4* 8.2* 8.7* QUESTIONABLE RESULTS, RECOMMEND RECOLLECT TO VERIFY 8.7*  MG  --   --  2.3 2.4 2.4 QUESTIONABLE RESULTS, RECOMMEND RECOLLECT TO VERIFY 2.3  AST 59*  --   --  22 23 QUESTIONABLE RESULTS, RECOMMEND RECOLLECT TO VERIFY 29  ALT 121*  --   --  55* 44 QUESTIONABLE RESULTS, RECOMMEND RECOLLECT TO VERIFY 47*  ALKPHOS 68  --   --  66 70 QUESTIONABLE RESULTS, RECOMMEND RECOLLECT TO VERIFY 65  BILITOT 0.7  --   --  0.2* 0.4 QUESTIONABLE RESULTS, RECOMMEND RECOLLECT TO VERIFY 0.6   < > = values in this interval not displayed.   ------------------------------------------------------------------------------------------------------------------ No results for input(s): CHOL, HDL, LDLCALC, TRIG, CHOLHDL, LDLDIRECT in the last 72 hours.  Lab Results  Component Value Date   HGBA1C 6.3 (H) 04/12/2019    ------------------------------------------------------------------------------------------------------------------ No results for input(s): TSH, T4TOTAL, T3FREE, THYROIDAB in the last 72 hours.  Invalid input(s): FREET3  Cardiac Enzymes No results for input(s): CKMB, TROPONINI, MYOGLOBIN in the last 168 hours.  Invalid input(s): CK ------------------------------------------------------------------------------------------------------------------    Component Value Date/Time   BNP 20.3 04/18/2019 0300    Micro Results No results found for this or any previous visit (from the past 240 hour(s)).  Radiology Reports Dg Abd 1 View  Result Date: 04/08/2019 CLINICAL DATA:  OG tube placement. EXAM: ABDOMEN - 1 VIEW COMPARISON:  None. FINDINGS: OG tube is in place with the tip in the distal stomach or first portion of the duodenum. IMPRESSION: As above. Electronically Signed   By: Inge Rise M.D.   On: 04/08/2019 15:05   Ct Chest Wo Contrast  Result Date: 04/15/2019 CLINICAL DATA:  Possible cavitary lesion on recent chest x-ray EXAM: CT CHEST WITHOUT CONTRAST TECHNIQUE: Multidetector CT imaging of the chest was performed following the standard protocol without IV contrast. COMPARISON:  04/15/2019 FINDINGS: Cardiovascular: Somewhat limited due to lack of IV contrast. No significant atherosclerotic calcifications are seen. No aneurysmal dilatation or dissection is noted. No cardiac enlargement is seen. At the confluence of the right internal jugular vein and right subclavian vein there is a rounded lipomatous lesion identified which likely sits adjacent to the confluence as opposed to actually being within the vein. Mediastinum/Nodes: Thoracic inlet is within normal limits. Few scattered small reactive lymph nodes are identified within the hila and mediastinum. The esophagus is within normal limits. Lungs/Pleura: Lungs again demonstrate multifocal confluent infiltrates throughout both lungs  similar to that seen on recent chest x-ray consistent with the given clinical history of COVID-19 positivity. No sizable effusion is noted. No pneumothorax is seen. The area of lucency noted on recent chest x-ray represents an area of spared lung as opposed to a true cavitary lesion. Upper Abdomen: Visualized upper abdomen is within normal limits. Musculoskeletal: No chest wall mass or suspicious bone lesions identified. IMPRESSION: Multifocal confluent infiltrates throughout both lungs consistent with the patient's given clinical history of COVID-19 positivity. The area of lucency seen on recent chest x-ray represents an area of spared lung on the right not a true cavitary lesion. No other focal abnormality is noted. Electronically Signed   By: Linus Mako.D.  On: 04/15/2019 15:03   Dg Chest Port 1 View  Result Date: 04/17/2019 CLINICAL DATA:  Shortness of breath. EXAM: PORTABLE CHEST 1 VIEW COMPARISON:  One-view chest x-ray 04/15/2019.  CT chest 04/15/2019 FINDINGS: Patchy bilateral airspace opacities are again noted. There is slight increase and upper lobe consolidation bilaterally. Lung volumes remain low. Visualized soft tissues and bony thorax are unremarkable. IMPRESSION: 1. Stable to slight increase in patchy bilateral airspace disease compatible with multi lobar COVID-19 pneumonia. Electronically Signed   By: San Morelle M.D.   On: 04/17/2019 08:25   Dg Chest Port 1 View  Result Date: 04/15/2019 CLINICAL DATA:  Shortness of breath, COVID-19 EXAM: PORTABLE CHEST 1 VIEW COMPARISON:  Portable exam 0940 hours compared to 04/08/2019 FINDINGS: Normal heart size, mediastinal contours, and pulmonary vascularity. Patchy BILATERAL pulmonary infiltrates consistent with pneumonia. Area of relative lucency in the RIGHT mid lung may represent spared parenchyma though attention on follow-up imaging recommended to exclude developing cavitation. No pleural effusion or pneumothorax. Bones unremarkable.  IMPRESSION: Patchy BILATERAL pulmonary infiltrates consistent with pneumonia. Lucency in the RIGHT mid lung, likely spared lung, but recommend attention on follow-up exams to exclude developing cavitation. Electronically Signed   By: Lavonia Dana M.D.   On: 04/15/2019 10:02   Dg Chest Port 1 View  Result Date: 04/08/2019 CLINICAL DATA:  Multifocal pneumonia.  COVID Positive. EXAM: PORTABLE CHEST 1 VIEW COMPARISON:  04/07/2019 FINDINGS: Endotracheal tube tip is 11 mm above the carina. NG tube tip is in the distal stomach or duodenal bulb. The patient has extensive bilateral hazy pulmonary infiltrates which appear less prominent than on the prior study but the aeration is improved in both lungs as compared to the prior exam. Heart size and vascularity are normal. No effusions. Bones are normal. IMPRESSION: 1. Extensive bilateral pulmonary infiltrates consistent with the patient's history of COVID-19. 2. Endotracheal tube is 11 mm above the carina and could be slightly retracted. Electronically Signed   By: Lorriane Shire M.D.   On: 04/08/2019 15:06   Dg Chest Port 1 View  Result Date: 04/07/2019 CLINICAL DATA:  Shortness of breath since last Friday, cough, chest pain, history asthma, reported COVID-19 positive test at another hospital EXAM: PORTABLE CHEST 1 VIEW COMPARISON:  Portable exam 1455 hours without priors for comparison FINDINGS: Upper normal heart size. Normal mediastinal contours. Patchy BILATERAL pulmonary infiltrates likely infectious. No definite pleural effusion or pneumothorax. Osseous structures unremarkable. IMPRESSION: Patchy BILATERAL pulmonary infiltrates consistent with pneumonia, including atypical etiologies. Electronically Signed   By: Lavonia Dana M.D.   On: 04/07/2019 15:34

## 2019-04-20 ENCOUNTER — Encounter (HOSPITAL_COMMUNITY): Payer: Self-pay

## 2019-04-20 LAB — GLUCOSE, CAPILLARY
Glucose-Capillary: 71 mg/dL (ref 70–99)
Glucose-Capillary: 105 mg/dL — ABNORMAL HIGH (ref 70–99)

## 2019-04-20 MED ORDER — CARVEDILOL 3.125 MG PO TABS: 3.1250 mg | ORAL_TABLET | Freq: Two times a day (BID) | ORAL | 11 refills | Status: DC

## 2019-04-20 MED ORDER — GUAIFENESIN-DM 100-10 MG/5ML PO SYRP: 10.0000 mL | ORAL_SOLUTION | Freq: Three times a day (TID) | ORAL | 0 refills | Status: AC | PRN

## 2019-04-20 MED ORDER — METFORMIN HCL 500 MG PO TABS: 500.0000 mg | ORAL_TABLET | Freq: Two times a day (BID) | ORAL | 0 refills | Status: DC

## 2019-04-20 MED ORDER — PREDNISONE 5 MG PO TABS: ORAL_TABLET | ORAL | 0 refills | Status: AC

## 2019-04-20 MED ORDER — DICLOFENAC SODIUM 1 % TD GEL: 2.0000 g | Freq: Two times a day (BID) | TRANSDERMAL | 0 refills | Status: AC | PRN

## 2019-04-20 NOTE — TOC Transition Note (Addendum)
Transition of Care [facility]) - CM/SW Discharge Note Marvetta Gibbons RN,BSN Transitions of Care [place] coverage  - RN Case Manager [phone]   Patient Details  Name: Joyce Mcguire MRN: [id number] Date of Birth: 12-Feb-1966  Transition of Care [facility]) CM/SW Contact:  Dawayne Patricia, RN Phone Number: 04/20/2019, 11:54 AM   Clinical Narrative:    Pt stable for transition home, orders have been placed for [facility] and home 02 DME needs. Call made to [facility] with Huey Romans for home 02 needs. Pt's daughter plans to come transport home- [facility] has been contacted to bring transport tank, pulse 0x and thermometer to bedside prior to discharge. Home equipment to be delivered house. Per CM working on sat- calls have been made to try to secure [facility] to [place]- f/u call made to cory with [facility]- however they are unable to accept the referral- calls also made to [facility]- which none can accept referral for [facility] services- bedside RN aware and will let pt know CM will continue to reach out to [facility] agencies and may have to wait until Monday AM to reach out to some to see if agency can be secured for HHRN/PT in her area. Spoke with Baker Janus at [facility]- states need to f/u with office in am regarding service to [facility]. CM will f/u in am with further [facility] agencies for [facility] needs.  Per Bedside RN-Suzanne- pt states that her PCP is at [facility]- phone #[phone]- will need f/u appointment made when they are open on 6/15.    Final next level of care: Max Barriers to Discharge: No Barriers Identified   Patient Goals and CMS Choice Patient states their goals for this hospitalization and ongoing recovery are:: home and to continue to recover CMS Medicare.gov Compare Post Acute Care list provided to:: Patient Choice offered to / list presented to : Patient  Discharge Placement  Home                     Discharge Plan and Services   Discharge Planning  Services: CM Consult Post Acute Care Choice: Home Health, Durable Medical Equipment          DME Arranged: Oxygen, Pulse oximeter DME Agency: [place] Date DME Agency Contacted: 04/20/19 Time DME Agency Contacted: 1006 Representative spoke with at DME Agency: Learta Codding HH Arranged: RN, PT          Social Determinants of Health ([place]) Interventions     Readmission Risk Interventions Readmission Risk Prevention Plan 04/20/2019  Transportation Screening Complete  PCP or Specialist Appt within 5-7 Days (No Data)  Home Care Screening Complete  Medication Review (RN CM) Complete

## 2019-04-20 NOTE — Progress Notes (Signed)
Discharge instructions reviewed with Pt and pt's daughter,  Both asked appropriate questions.  Pt did have her oxygen delivered to her home prior to her discharge. Pt take to front of hospital to meet her daughter via w/c with portable o2 take  Pt had all of her belonging with her, including her cell phone

## 2019-04-20 NOTE — Plan of Care (Signed)
  Problem: Education: Goal: Knowledge of General Education information will improve Description: Including pain rating scale, medication(s)/side effects and non-pharmacologic comfort measures 04/20/2019 1730 by Shanon Ace, RN Outcome: Adequate for Discharge 04/20/2019 0958 by Shanon Ace, RN Outcome: Progressing   Problem: Health Behavior/Discharge Planning: Goal: Ability to manage health-related needs will improve 04/20/2019 1730 by Shanon Ace, RN Outcome: Adequate for Discharge 04/20/2019 0958 by Shanon Ace, RN Outcome: Progressing   Problem: Clinical Measurements: Goal: Ability to maintain clinical measurements within normal limits will improve 04/20/2019 1730 by Shanon Ace, RN Outcome: Adequate for Discharge 04/20/2019 0958 by Shanon Ace, RN Outcome: Progressing Goal: Will remain free from infection 04/20/2019 1730 by Shanon Ace, RN Outcome: Adequate for Discharge 04/20/2019 0958 by Shanon Ace, RN Outcome: Progressing Goal: Diagnostic test results will improve 04/20/2019 1730 by Shanon Ace, RN Outcome: Adequate for Discharge 04/20/2019 0958 by Shanon Ace, RN Outcome: Progressing Goal: Respiratory complications will improve 04/20/2019 1730 by Shanon Ace, RN Outcome: Adequate for Discharge 04/20/2019 0958 by Shanon Ace, RN Outcome: Progressing Goal: Cardiovascular complication will be avoided 04/20/2019 1730 by Shanon Ace, RN Outcome: Adequate for Discharge 04/20/2019 0958 by Shanon Ace, RN Outcome: Progressing   Problem: Activity: Goal: Risk for activity intolerance will decrease 04/20/2019 1730 by Shanon Ace, RN Outcome: Adequate for Discharge 04/20/2019 0958 by Shanon Ace, RN Outcome: Progressing   Problem: Nutrition: Goal: Adequate nutrition will be maintained 04/20/2019 1730 by Shanon Ace, RN Outcome: Adequate for Discharge 04/20/2019 0958 by Shanon Ace, RN Outcome: Progressing   Problem: Coping: Goal: Level of anxiety  will decrease 04/20/2019 1730 by Shanon Ace, RN Outcome: Adequate for Discharge 04/20/2019 0958 by Shanon Ace, RN Outcome: Progressing   Problem: Elimination: Goal: Will not experience complications related to bowel motility 04/20/2019 1730 by Shanon Ace, RN Outcome: Adequate for Discharge 04/20/2019 0958 by Shanon Ace, RN Outcome: Progressing Goal: Will not experience complications related to urinary retention 04/20/2019 1730 by Shanon Ace, RN Outcome: Adequate for Discharge 04/20/2019 0958 by Shanon Ace, RN Outcome: Progressing   Problem: Pain Managment: Goal: General experience of comfort will improve 04/20/2019 1730 by Shanon Ace, RN Outcome: Adequate for Discharge 04/20/2019 0958 by Shanon Ace, RN Outcome: Progressing   Problem: Safety: Goal: Ability to remain free from injury will improve 04/20/2019 1730 by Shanon Ace, RN Outcome: Adequate for Discharge 04/20/2019 0958 by Shanon Ace, RN Outcome: Progressing   Problem: Skin Integrity: Goal: Risk for impaired skin integrity will decrease 04/20/2019 1730 by Shanon Ace, RN Outcome: Adequate for Discharge 04/20/2019 0958 by Shanon Ace, RN Outcome: Progressing

## 2019-04-20 NOTE — Plan of Care (Signed)

## 2019-04-20 NOTE — Discharge Instructions (Signed)
Follow with Primary MD in 7 days   Get CBC, CMP, 2 view Chest X ray -  checked next visit within 1 week by Primary MD    Activity: As tolerated with Full fall precautions use walker/cane & assistance as needed  Disposition Home    Diet: Heart Healthy  Low Carb.   Accuchecks 4 times/day, Once in AM empty stomach and then before each meal. Log in all results and show them to your Prim.MD in 3 days. If any glucose reading is under 80 or above 300 call your Prim MD immidiately. Follow Low glucose instructions for glucose under 80 as instructed.   Special Instructions: If you have smoked or chewed Tobacco  in the last 2 yrs please stop smoking, stop any regular Alcohol  and or any Recreational drug use.  On your next visit with your primary care physician please Get Medicines reviewed and adjusted.  Please request your Prim.MD to go over all Hospital Tests and Procedure/Radiological results at the follow up, please get all Hospital records sent to your Prim MD by signing hospital release before you go home.  If you experience worsening of your admission symptoms, develop shortness of breath, life threatening emergency, suicidal or homicidal thoughts you must seek medical attention immediately by calling 911 or calling your MD immediately  if symptoms less severe.  You Must read complete instructions/literature along with all the possible adverse reactions/side effects for all the Medicines you take and that have been prescribed to you. Take any new Medicines after you have completely understood and accpet all the possible adverse reactions/side effects.      Person Under Monitoring Name: Joyce Mcguire  Location: [address] [place] [id number]   Infection Prevention Recommendations for Individuals Confirmed to have, or Being Evaluated for, 2019 Novel Coronavirus (COVID-19) Infection Who Receive Care at Home  Individuals who are confirmed to have, or are being evaluated for, COVID-19  should follow the prevention steps below until a healthcare provider or local or state health department says they can return to normal activities.  Stay home except to get medical care You should restrict activities outside your home, except for getting medical care. Do not go to work, school, or public areas, and do not use public transportation or taxis.  Call ahead before visiting your doctor Before your medical appointment, call the healthcare provider and tell them that you have, or are being evaluated for, COVID-19 infection. This will help the healthcare providers office take steps to keep other people from getting infected. Ask your healthcare provider to call the local or state health department.  Monitor your symptoms Seek prompt medical attention if your illness is worsening (e.g., difficulty breathing). Before going to your medical appointment, call the healthcare provider and tell them that you have, or are being evaluated for, COVID-19 infection. Ask your healthcare provider to call the local or state health department.  Wear a facemask You should wear a facemask that covers your nose and mouth when you are in the same room with other people and when you visit a healthcare provider. People who live with or visit you should also wear a facemask while they are in the same room with you.  Separate yourself from other people in your home As much as possible, you should stay in a different room from other people in your home. Also, you should use a separate bathroom, if available.  Avoid sharing household items You should not share dishes, drinking glasses, cups,  eating utensils, towels, bedding, or other items with other people in your home. After using these items, you should wash them thoroughly with soap and water.  Cover your coughs and sneezes Cover your mouth and nose with a tissue when you cough or sneeze, or you can cough or sneeze into your sleeve. Throw used  tissues in a lined trash can, and immediately wash your hands with soap and water for at least 20 seconds or use an alcohol-based hand rub.  Wash your [facility] your hands often and thoroughly with soap and water for at least 20 seconds. You can use an alcohol-based hand sanitizer if soap and water are not available and if your hands are not visibly dirty. Avoid touching your eyes, nose, and mouth with unwashed hands.   Prevention Steps for Caregivers and Household Members of Individuals Confirmed to have, or Being Evaluated for, COVID-19 Infection Being Cared for in the Home  If you live with, or provide care at home for, a person confirmed to have, or being evaluated for, COVID-19 infection please follow these guidelines to prevent infection:  Follow healthcare providers instructions Make sure that you understand and can help the patient follow any healthcare provider instructions for all care.  Provide for the patients basic needs You should help the patient with basic needs in the home and provide support for getting groceries, prescriptions, and other personal needs.  Monitor the patients symptoms If they are getting sicker, call his or her medical provider and tell them that the patient has, or is being evaluated for, COVID-19 infection. This will help the healthcare providers office take steps to keep other people from getting infected. Ask the healthcare provider to call the local or state health department.  Limit the number of people who have contact with the patient  If possible, have only one caregiver for the patient.  Other household members should stay in another home or place of residence. If this is not possible, they should stay  in another room, or be separated from the patient as much as possible. Use a separate bathroom, if available.  Restrict visitors who do not have an essential need to be in the home.  Keep older adults, very young children, and  other sick people away from the patient Keep older adults, very young children, and those who have compromised immune systems or chronic health conditions away from the patient. This includes people with chronic heart, lung, or kidney conditions, diabetes, and cancer.  Ensure good ventilation Make sure that shared spaces in the home have good air flow, such as from an air conditioner or an opened window, weather permitting.  Wash your hands often  Wash your hands often and thoroughly with soap and water for at least 20 seconds. You can use an alcohol based hand sanitizer if soap and water are not available and if your hands are not visibly dirty.  Avoid touching your eyes, nose, and mouth with unwashed hands.  Use disposable paper towels to dry your hands. If not available, use dedicated cloth towels and replace them when they become wet.  Wear a facemask and gloves  Wear a disposable facemask at all times in the room and gloves when you touch or have contact with the patients blood, body fluids, and/or secretions or excretions, such as sweat, saliva, sputum, nasal mucus, vomit, urine, or feces.  Ensure the mask fits over your nose and mouth tightly, and do not touch it during use.  Throw out  disposable facemasks and gloves after using them. Do not reuse.  Wash your hands immediately after removing your facemask and gloves.  If your personal clothing becomes contaminated, carefully remove clothing and launder. Wash your hands after handling contaminated clothing.  Place all used disposable facemasks, gloves, and other waste in a lined container before disposing them with other household waste.  Remove gloves and wash your hands immediately after handling these items.  Do not share dishes, glasses, or other household items with the patient  Avoid sharing household items. You should not share dishes, drinking glasses, cups, eating utensils, towels, bedding, or other items with a patient  who is confirmed to have, or being evaluated for, COVID-19 infection.  After the person uses these items, you should wash them thoroughly with soap and water.  Wash laundry thoroughly  Immediately remove and wash clothes or bedding that have blood, body fluids, and/or secretions or excretions, such as sweat, saliva, sputum, nasal mucus, vomit, urine, or feces, on them.  Wear gloves when handling laundry from the patient.  Read and follow directions on labels of laundry or clothing items and detergent. In general, wash and dry with the warmest temperatures recommended on the label.  Clean all areas the individual has used often  Clean all touchable surfaces, such as counters, tabletops, doorknobs, bathroom fixtures, toilets, phones, keyboards, tablets, and bedside tables, every day. Also, clean any surfaces that may have blood, body fluids, and/or secretions or excretions on them.  Wear gloves when cleaning surfaces the patient has come in contact with.  Use a diluted bleach solution (e.g., dilute bleach with 1 part bleach and 10 parts water) or a household disinfectant with a label that says EPA-registered for coronaviruses. To make a bleach solution at home, add 1 tablespoon of bleach to 1 quart (4 cups) of water. For a larger supply, add  cup of bleach to 1 gallon (16 cups) of water.  Read labels of cleaning products and follow recommendations provided on product labels. Labels contain instructions for safe and effective use of the cleaning product including precautions you should take when applying the product, such as wearing gloves or eye protection and making sure you have good ventilation during use of the product.  Remove gloves and wash hands immediately after cleaning.  Monitor yourself for signs and symptoms of illness Caregivers and household members are considered close contacts, should monitor their health, and will be asked to limit movement outside of the home to the extent  possible. Follow the monitoring steps for close contacts listed on the symptom monitoring form.   ? If you have additional questions, contact your local health department or call the epidemiologist on call at [phone] (available 24/7). ? This guidance is subject to change. For the most up-to-date guidance from [facility], please refer to their website: [employer]Met[employer].huhttps://www.cdc.gov/coronavirus/2019-ncov/hcp/guidance-prevent-spread.html

## 2019-04-20 NOTE — Discharge Summary (Signed)
Joyce SlickMaria Grissom NWG:[id number] DOB: 1965-12-21 DOA: 04/07/2019  PCP: System, Pcp Not In  Admit date: 04/07/2019  Discharge date: 04/20/2019  Admitted From: Home  Disposition:  Home   Recommendations for Outpatient Follow-up:   Follow up with PCP in 1-2 weeks  PCP Please obtain BMP/CBC, 2 view CXR in 1week,  (see Discharge instructions)   PCP Please follow up on the following pending results:    Home Health: PT, RN   Equipment/Devices: o2 2Lit  Consultations: None Discharge Condition: Stable   CODE STATUS: Full   Diet Recommendation: Heart Healthy Low Carb    Chief Complaint  Patient presents with   Shortness of Breath     Brief history of present illness from the day of admission and additional interim summary    [name] is a52 y.o.F with no significant PMHx who presented with 5 days cough, progressive SOB.  In the ER, SpO2 <90%, required 4L [place]. CXR showed bilateral opacities, SARS-CoV-2 NAA positive. Transferred to [facility], intubated on hospital day 2 due to tachypnea, fatigue, escalating O2 requirements. She was givenRemdesivir, Actemra and convalescent plasma with improvement.                                                                 Hospital Course    1. Acute Hypoxic Resp. Failure due to Acute Covid 19 Viral Illness during the ongoing 2020 Covid 19 Pandemic - requiring Intubation and Vent support, received Remdesivir, Actemra, Steroids & Plasma, with some improvement and was eventually extubated, remains on high flow nasal cannula oxygen, CT chest and chest x-ray show extensive interstitial lung injury, encouraged to sit up in chair, added Pulm toiletry through IS + Flutter valve.  With supportive care and gradual steroid taper she gradually improved and now down to 1 L nasal cannula  oxygen, symptom-free will be discharged home on a short course of steroid taper, follow with PCP, also requires outpatient cardiology follow-up see below.  COVID-19 Labs  Recent Labs    04/18/19 0300 04/19/19 0400  DDIMER 0.50  --   FERRITIN 346*  --   LDH 319* 278*  CRP <0.8  --     Lab Results  Component Value Date   SARSCOV2NAA POSITIVE (A) 04/07/2019    Hepatic Function Panel     Component Value Date/Time   PROT 6.1 (L) 04/18/2019 0300   ALBUMIN 3.1 (L) 04/18/2019 0300   AST 29 04/18/2019 0300   ALT 47 (H) 04/18/2019 0300   ALKPHOS 65 04/18/2019 0300   BILITOT 0.6 04/18/2019 0300     2.  Transaminitis -  Due to combination of Covid + Actemra, no pain, LFTs are now close to normal.  Repeat CMP in a week by PCP.  3.  Toxic Encephalopathy - resolved.  4. UTI - pan  sensitive Ecoli, she was treated with Rocephin x 3 days.   5.  Acute on chronic nonspecific CHF.  No previous echo EF on file.    She has been adequately diuresed and is euvolemic, will be placed on Coreg and will be requested to follow with cardiologist outpatient and get an echocardiogram within a week.    6.  Hypokalemia.  Replaced and stable.  7.  ? cavitary lung lesion on chest x-ray.  Stable CT no cavitary lesion.  8.  AKI.  Due to dehydration.  Resolved after IV fluids.    9.  Musculoskeletal upper back pain.  Voltaren gel.    10. Pre DM 2 - A1c 6.3, placed on Glucophage with outpatient PCP follow-up.   Discharge diagnosis     Principal Problem:   Altered mental status Active Problems:   COVID-19 virus infection   Asthma   Acute respiratory failure with hypoxemia (HCC)   Acute respiratory failure with hypoxia [facility](HCC)    Discharge instructions    Discharge Instructions    Discharge instructions   Complete by: As directed    Follow with Primary MD in 7 days   Get CBC, CMP, 2 view Chest X ray -  checked next visit within 1 week by Primary MD    Activity: As tolerated with  Full fall precautions use walker/cane & assistance as needed  Disposition Home    Diet: Heart Healthy  Low Carb.   Accuchecks 4 times/day, Once in AM empty stomach and then before each meal. Log in all results and show them to your Prim.MD in 3 days. If any glucose reading is under 80 or above 300 call your Prim MD immidiately. Follow Low glucose instructions for glucose under 80 as instructed.   Special Instructions: If you have smoked or chewed Tobacco  in the last 2 yrs please stop smoking, stop any regular Alcohol  and or any Recreational drug use.  On your next visit with your primary care physician please Get Medicines reviewed and adjusted.  Please request your Prim.MD to go over all Hospital Tests and Procedure/Radiological results at the follow up, please get all Hospital records sent to your Prim MD by signing hospital release before you go home.  If you experience worsening of your admission symptoms, develop shortness of breath, life threatening emergency, suicidal or homicidal thoughts you must seek medical attention immediately by calling 911 or calling your MD immediately  if symptoms less severe.  You Must read complete instructions/literature along with all the possible adverse reactions/side effects for all the Medicines you take and that have been prescribed to you. Take any new Medicines after you have completely understood and accpet all the possible adverse reactions/side effects.   Increase activity slowly   Complete by: As directed    MyChart COVID-19 home monitoring program   Complete by: Apr 20, 2019    Is the patient willing to use the MyChart Mobile App for home monitoring?: Yes   Temperature monitoring   Complete by: Apr 20, 2019    After how many days would you like to receive a notification of this patient's flowsheet entries?: 1      Discharge Medications   Allergies as of 04/20/2019      Reactions   Lactose Intolerance (gi) Diarrhea, Nausea And  Vomiting      Medication List    STOP taking these medications   ibuprofen 800 MG tablet Commonly known as: ADVIL     TAKE these  medications   albuterol 108 (90 Base) MCG/ACT inhaler Commonly known as: VENTOLIN HFA Inhale 2 puffs into the lungs every 4 (four) hours as needed for shortness of breath or wheezing.   carvedilol 3.125 MG tablet Commonly known as: Coreg Take 1 tablet (3.125 mg total) by mouth 2 (two) times daily.   diclofenac sodium 1 % Gel Commonly known as: VOLTAREN Apply 2 g topically 2 (two) times daily as needed (upper back pain, apply to upper back).   guaiFENesin-dextromethorphan 100-10 MG/5ML syrup Commonly known as: ROBITUSSIN DM Take 10 mLs by mouth every 8 (eight) hours as needed for cough.   metFORMIN 500 MG tablet Commonly known as: Glucophage Take 1 tablet (500 mg total) by mouth 2 (two) times daily with a meal.   predniSONE 5 MG tablet Commonly known as: DELTASONE Label  & dispense according to the schedule below. take 6 Pills PO for 3 days, 4 Pills PO for 3 days, 2 Pills PO for 3 days, 1 Pills PO for 3 days, 1/2 Pill  PO for 3 days then STOP. Total 50 pills.            Durable Medical Equipment  (From admission, onward)         Start     Ordered   04/19/19 0944  For home use only DME oxygen  Once    Question Answer Comment  Length of Need 6 Months   Mode or (Route) Nasal cannula   Liters per Minute 2   Frequency Continuous (stationary and portable oxygen unit needed)   Oxygen conserving device Yes   Oxygen delivery system Gas      04/19/19 0944          Follow-up Information    Your PCP. Schedule an appointment as soon as possible for a visit in 1 week(s).        Jerline Pain, MD. Schedule an appointment as soon as possible for a visit in 1 week(s).   Specialty: Cardiology Why: CHF, needs echocardiogram and cardiology follow-up Contact information: [address] [place]  [address] [phone]           Major procedures and Radiology Reports - PLEASE review detailed and final reports thoroughly  -         Dg Abd 1 View  Result Date: 04/08/2019 CLINICAL DATA:  OG tube placement. EXAM: ABDOMEN - 1 VIEW COMPARISON:  None. FINDINGS: OG tube is in place with the tip in the distal stomach or first portion of the duodenum. IMPRESSION: As above. Electronically Signed   By: Inge Rise M.D.   On: 04/08/2019 15:05   Ct Chest Wo Contrast  Result Date: 04/15/2019 CLINICAL DATA:  Possible cavitary lesion on recent chest x-ray EXAM: CT CHEST WITHOUT CONTRAST TECHNIQUE: Multidetector CT imaging of the chest was performed following the standard protocol without IV contrast. COMPARISON:  04/15/2019 FINDINGS: Cardiovascular: Somewhat limited due to lack of IV contrast. No significant atherosclerotic calcifications are seen. No aneurysmal dilatation or dissection is noted. No cardiac enlargement is seen. At the confluence of the right internal jugular vein and right subclavian vein there is a rounded lipomatous lesion identified which likely sits adjacent to the confluence as opposed to actually being within the vein. Mediastinum/Nodes: Thoracic inlet is within normal limits. Few scattered small reactive lymph nodes are identified within the hila and mediastinum. The esophagus is within normal limits. Lungs/Pleura: Lungs again demonstrate multifocal confluent infiltrates throughout both lungs similar to that  seen on recent chest x-ray consistent with the given clinical history of COVID-19 positivity. No sizable effusion is noted. No pneumothorax is seen. The area of lucency noted on recent chest x-ray represents an area of spared lung as opposed to a true cavitary lesion. Upper Abdomen: Visualized upper abdomen is within normal limits. Musculoskeletal: No chest wall mass or suspicious bone lesions identified. IMPRESSION: Multifocal confluent infiltrates throughout both lungs  consistent with the patient's given clinical history of COVID-19 positivity. The area of lucency seen on recent chest x-ray represents an area of spared lung on the right not a true cavitary lesion. No other focal abnormality is noted. Electronically Signed   By: Alcide Clever M.D.   On: 04/15/2019 15:03   Dg Chest Port 1 View  Result Date: 04/17/2019 CLINICAL DATA:  Shortness of breath. EXAM: PORTABLE CHEST 1 VIEW COMPARISON:  One-view chest x-ray 04/15/2019.  CT chest 04/15/2019 FINDINGS: Patchy bilateral airspace opacities are again noted. There is slight increase and upper lobe consolidation bilaterally. Lung volumes remain low. Visualized soft tissues and bony thorax are unremarkable. IMPRESSION: 1. Stable to slight increase in patchy bilateral airspace disease compatible with multi lobar COVID-19 pneumonia. Electronically Signed   By: Marin Roberts M.D.   On: 04/17/2019 08:25   Dg Chest Port 1 View  Result Date: 04/15/2019 CLINICAL DATA:  Shortness of breath, COVID-19 EXAM: PORTABLE CHEST 1 VIEW COMPARISON:  Portable exam 0940 hours compared to 04/08/2019 FINDINGS: Normal heart size, mediastinal contours, and pulmonary vascularity. Patchy BILATERAL pulmonary infiltrates consistent with pneumonia. Area of relative lucency in the RIGHT mid lung may represent spared parenchyma though attention on follow-up imaging recommended to exclude developing cavitation. No pleural effusion or pneumothorax. Bones unremarkable. IMPRESSION: Patchy BILATERAL pulmonary infiltrates consistent with pneumonia. Lucency in the RIGHT mid lung, likely spared lung, but recommend attention on follow-up exams to exclude developing cavitation. Electronically Signed   By: Ulyses Southward M.D.   On: 04/15/2019 10:02   Dg Chest Port 1 View  Result Date: 04/08/2019 CLINICAL DATA:  Multifocal pneumonia.  COVID Positive. EXAM: PORTABLE CHEST 1 VIEW COMPARISON:  04/07/2019 FINDINGS: Endotracheal tube tip is 11 mm above the carina. NG  tube tip is in the distal stomach or duodenal bulb. The patient has extensive bilateral hazy pulmonary infiltrates which appear less prominent than on the prior study but the aeration is improved in both lungs as compared to the prior exam. Heart size and vascularity are normal. No effusions. Bones are normal. IMPRESSION: 1. Extensive bilateral pulmonary infiltrates consistent with the patient's history of COVID-19. 2. Endotracheal tube is 11 mm above the carina and could be slightly retracted. Electronically Signed   By: Francene Boyers M.D.   On: 04/08/2019 15:06   Dg Chest Port 1 View  Result Date: 04/07/2019 CLINICAL DATA:  Shortness of breath since last Friday, cough, chest pain, history asthma, reported COVID-19 positive test at another hospital EXAM: PORTABLE CHEST 1 VIEW COMPARISON:  Portable exam 1455 hours without priors for comparison FINDINGS: Upper normal heart size. Normal mediastinal contours. Patchy BILATERAL pulmonary infiltrates likely infectious. No definite pleural effusion or pneumothorax. Osseous structures unremarkable. IMPRESSION: Patchy BILATERAL pulmonary infiltrates consistent with pneumonia, including atypical etiologies. Electronically Signed   By: Ulyses Southward M.D.   On: 04/07/2019 15:34    Micro Results     No results found for this or any previous visit (from the past 240 hour(s)).  Mcguire   Subjective    Joyce Mcguire has no headache,no  chest abdominal pain,no new weakness tingling or numbness, feels much better wants to go home Mcguire.     Objective   Blood pressure 119/77, pulse 95, temperature 98 F (36.7 C), resp. rate 20, height 5' (1.524 m), weight 68.3 kg, SpO2 (!) 89 %.   Intake/Output Summary (Last 24 hours) at 04/20/2019 0929 Last data filed at 04/20/2019 0615 Gross per 24 hour  Intake 120 ml  Output --  Net 120 ml    Exam  Awake Alert, Oriented x 3, No new F.N deficits, Normal affect [place].AT,PERRAL Supple Neck,No JVD, No cervical  lymphadenopathy appriciated.  Symmetrical Chest wall movement, Good air movement bilaterally, CTAB RRR,No Gallops,Rubs or new Murmurs, No Parasternal Heave +ve B.Sounds, Abd Soft, Non tender, No organomegaly appriciated, No rebound -guarding or rigidity. No Cyanosis, Clubbing or edema, No new Rash or bruise   Data Review   CBC w Diff:  Lab Results  Component Value Date   WBC 8.8 04/18/2019   HGB 13.4 04/18/2019   HCT 40.6 04/18/2019   PLT 282 04/18/2019   LYMPHOPCT 11 04/18/2019   BANDSPCT  04/17/2019    QUESTIONABLE RESULTS, RECOMMEND RECOLLECT TO VERIFY   MONOPCT 4 04/18/2019   EOSPCT 0 04/18/2019   BASOPCT 0 04/18/2019    CMP:  Lab Results  Component Value Date   NA 138 04/18/2019   K 4.3 04/18/2019   CL 105 04/18/2019   CO2 24 04/18/2019   BUN 22 (H) 04/18/2019   CREATININE 0.68 04/18/2019   PROT 6.1 (L) 04/18/2019   ALBUMIN 3.1 (L) 04/18/2019   BILITOT 0.6 04/18/2019   ALKPHOS 65 04/18/2019   AST 29 04/18/2019   ALT 47 (H) 04/18/2019  . Lab Results  Component Value Date   HGBA1C 6.3 (H) 04/12/2019     Total Time in preparing paper work, data evaluation and todays exam - 35 minutes  Susa RaringPrashant Kyrin Gratz M.D on 04/20/2019 at 9:29 AM  [facility]   Office  [phone]

## 2019-04-29 NOTE — Progress Notes (Signed)
Patient ID: Joyce Mcguire, female   DOB: [birth year], 53 y.o.   MRN: [id number]  Virtual Visit via Video Note  I connected with [name] on 04/30/19 at  2:10 PM EDT by a video enabled telemedicine application and verified that I am speaking with the correct person using two identifiers.   I discussed the limitations of evaluation and management by telemedicine and the availability of in person appointments. The patient expressed understanding and agreed to proceed.  Patient location:  home My Location:  [place] office Persons on the video:  Me, the patient, and her daughter.     History of Present Illness: Patient is establishing care with [place] after being hospitalized 04/07/2019-04/20/2019 for Covid-19.  She was also found to be diabetic with A1C=6.3 and have htn.  She was discharged on carvedilol and metformin.  She is feeling much better.  Seems to be improving daily.  Still with some cough and would like meds to help with this bc diabetic tussin not helping.  Only using oxygen occasionally.  Sometimes gets SOB with exertion.  Appetite improving.  No fever.  No abdominal pain.   Observations/Objective:  A&Ox3.  Appears well, not toxic.  NAD  Assessment and Plan: 1. COVID-19 virus infection Resolving;  Much improved.  Still has some cough - benzonatate (TESSALON) 100 MG capsule; Take 2 capsules (200 mg total) by mouth 3 (three) times daily as needed.  Dispense: 40 capsule; Refill: 0  2. Type 2 diabetes mellitus with hyperglycemia, without long-term current use of insulin (HCC) New diagnosis - metFORMIN (GLUCOPHAGE) 500 MG tablet; Take 1 tablet (500 mg total) by mouth 2 (two) times daily with a meal.  Dispense: 180 tablet; Refill: 1 - Ambulatory referral to Cardiology  3. Hypertension, unspecified type - carvedilol (COREG) 3.125 MG tablet; Take 1 tablet (3.125 mg total) by mouth 2 (two) times daily.  Dispense: 60 tablet; Refill: 11 - Ambulatory referral to Cardiology  4. Elevated  LFTs -will check at inpatient f/up  5. Acute on chronic congestive heart failure, unspecified heart failure type ([place]) Continue Coreg as directed - Ambulatory referral to Cardiology  6. Hospital discharge follow-up - Ambulatory referral to Cardiology    Follow Up Instructions: Assign PCP and blood work in 3-4 weeks   I discussed the assessment and treatment plan with the patient. The patient was provided an opportunity to ask questions and all were answered. The patient agreed with the plan and demonstrated an understanding of the instructions.   The patient was advised to call back or seek an in-person evaluation if the symptoms worsen or if the condition fails to improve as anticipated.  I provided 26 minutes of non-face-to-face time during this encounter.   Freeman Caldron, PA-C

## 2019-04-30 ENCOUNTER — Telehealth (HOSPITAL_BASED_OUTPATIENT_CLINIC_OR_DEPARTMENT_OTHER): Payer: BC Managed Care – PPO | Admitting: Physician Assistant

## 2019-04-30 DIAGNOSIS — R945 Abnormal results of liver function studies: Secondary | ICD-10-CM

## 2019-04-30 DIAGNOSIS — I509 Heart failure, unspecified: Secondary | ICD-10-CM | POA: Diagnosis not present

## 2019-04-30 DIAGNOSIS — R7989 Other specified abnormal findings of blood chemistry: Secondary | ICD-10-CM

## 2019-04-30 DIAGNOSIS — Z09 Encounter for follow-up examination after completed treatment for conditions other than malignant neoplasm: Secondary | ICD-10-CM

## 2019-04-30 DIAGNOSIS — E1165 Type 2 diabetes mellitus with hyperglycemia: Secondary | ICD-10-CM | POA: Diagnosis not present

## 2019-04-30 DIAGNOSIS — U071 COVID-19: Secondary | ICD-10-CM | POA: Diagnosis not present

## 2019-04-30 DIAGNOSIS — I10 Essential (primary) hypertension: Secondary | ICD-10-CM

## 2019-04-30 MED ORDER — METFORMIN HCL 500 MG PO TABS: 500.0000 mg | ORAL_TABLET | Freq: Two times a day (BID) | ORAL | 1 refills | Status: AC

## 2019-04-30 MED ORDER — CARVEDILOL 3.125 MG PO TABS: 3.1250 mg | ORAL_TABLET | Freq: Two times a day (BID) | ORAL | 11 refills | Status: AC

## 2019-04-30 MED ORDER — CARVEDILOL 3.125 MG PO TABS: 3.1250 mg | ORAL_TABLET | Freq: Two times a day (BID) | ORAL | 11 refills | Status: DC

## 2019-04-30 MED ORDER — BENZONATATE 100 MG PO CAPS: 200.0000 mg | ORAL_CAPSULE | Freq: Three times a day (TID) | ORAL | 0 refills | Status: AC | PRN

## 2019-05-05 ENCOUNTER — Ambulatory Visit: Payer: BC Managed Care – PPO | Admitting: Cardiology

## 2019-05-26 ENCOUNTER — Ambulatory Visit: Payer: BC Managed Care – PPO | Admitting: Cardiology

## 2019-05-29 ENCOUNTER — Other Ambulatory Visit: Payer: Self-pay

## 2019-05-29 ENCOUNTER — Ambulatory Visit: Payer: BC Managed Care – PPO | Attending: Family Medicine | Admitting: Family Medicine

## 2019-08-27 LAB — BLOOD GAS, VENOUS
Bicarbonate: 25.9 mmol/L (ref 20.0–28.0)
Drawn by: 25788
Patient temperature: 98.6
pCO2, Ven: 30.2 mmHg — ABNORMAL LOW (ref 44.0–60.0)
pH, Ven: 7.543 — ABNORMAL HIGH (ref 7.250–7.430)

## 2020-09-06 IMAGING — DX PORTABLE CHEST - 1 VIEW
1 series · 1 of 1 positions shown · non-contrast
Comparison: Portable exam 7322 hours without priors for comparison

CLINICAL DATA: Shortness of breath since [REDACTED], cough, chest
pain, history asthma, reported [id number] positive test at another
hospital

EXAM:
PORTABLE CHEST 1 VIEW

[chest ap]
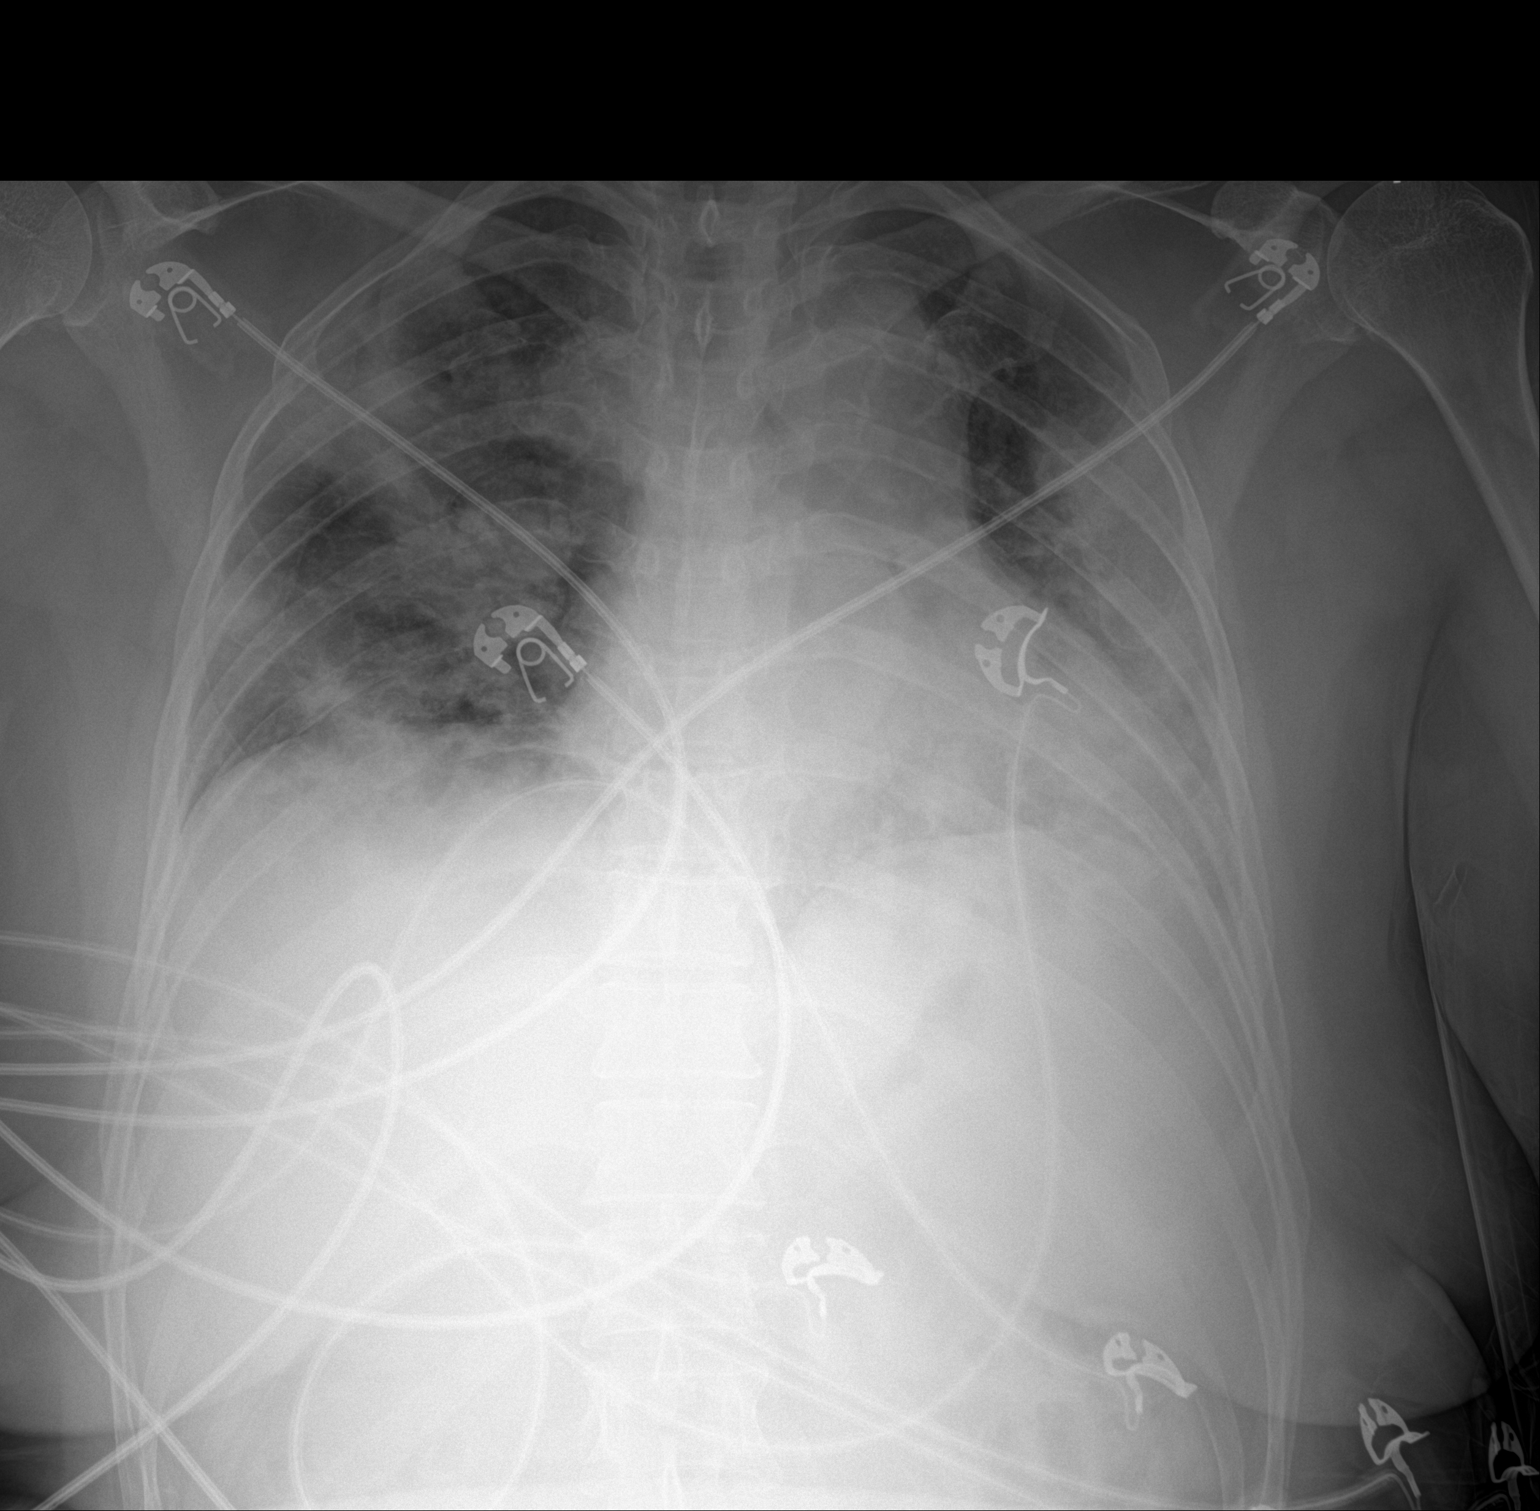

[1 of 1 positions shown; findings below may reference images not displayed]

FINDINGS: Upper normal heart size.

Normal mediastinal contours.

Patchy BILATERAL pulmonary infiltrates likely infectious.

No definite pleural effusion or pneumothorax.

Osseous structures unremarkable.
IMPRESSION: Patchy BILATERAL pulmonary infiltrates consistent with pneumonia,
including atypical etiologies.

## 2020-09-14 IMAGING — DX PORTABLE CHEST - 1 VIEW
1 series · 1 of 1 positions shown · non-contrast
Comparison: Portable exam 0670 hours compared to 04/08/2019

CLINICAL DATA: Shortness of breath, [id number]

EXAM:
PORTABLE CHEST 1 VIEW

[chest]
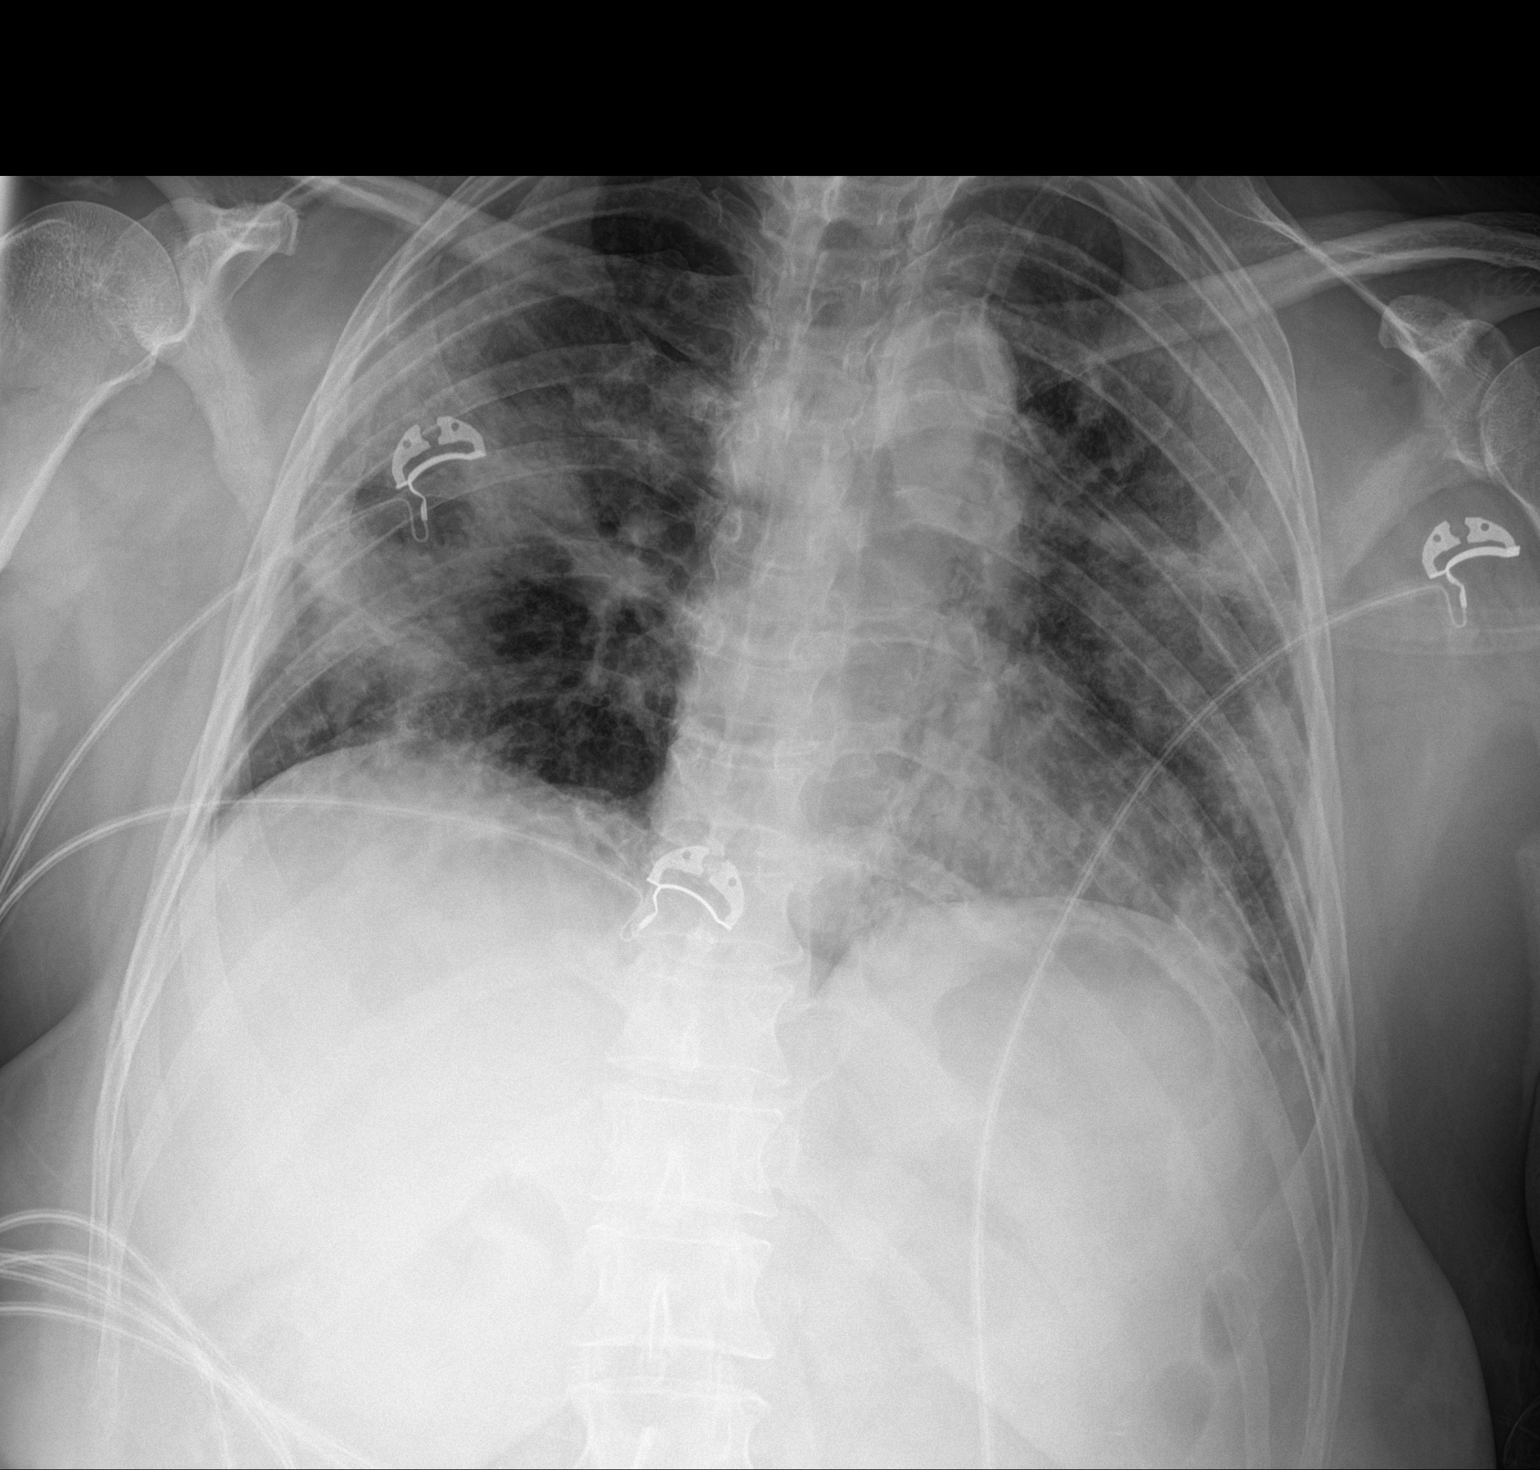

[1 of 1 positions shown; findings below may reference images not displayed]

FINDINGS: Normal heart size, mediastinal contours, and pulmonary vascularity.

Patchy BILATERAL pulmonary infiltrates consistent with pneumonia.

Area of relative lucency in the RIGHT mid lung may represent spared
parenchyma though attention on follow-up imaging recommended to
exclude developing cavitation.

No pleural effusion or pneumothorax.

Bones unremarkable.
IMPRESSION: Patchy BILATERAL pulmonary infiltrates consistent with pneumonia.

Lucency in the RIGHT mid lung, likely spared lung, but recommend
attention on follow-up exams to exclude developing cavitation.

## 2020-09-14 IMAGING — CT CT CHEST WITHOUT CONTRAST
1 of 3 series · 15 of 31 positions shown, 19 images · non-contrast
Comparison: 04/15/2019

CLINICAL DATA: Possible cavitary lesion on recent chest x-ray

EXAM:
CT CHEST WITHOUT CONTRAST
TECHNIQUE: Multidetector CT imaging of the chest was performed following the
standard protocol without IV contrast.

[Series 3: (person_name) thins · axial · 0.59mm/px · z∈[+983,+1206]mm · 15 of 352 slices shown, 19 images]
[im 17/352  mediastinal]
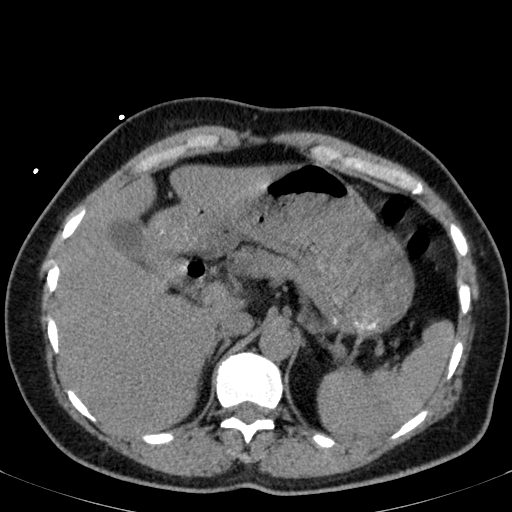
[im 17/352  lung]
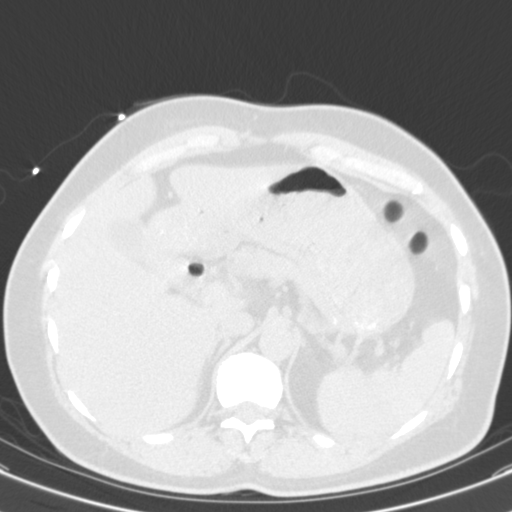
[im 51/352  lung]
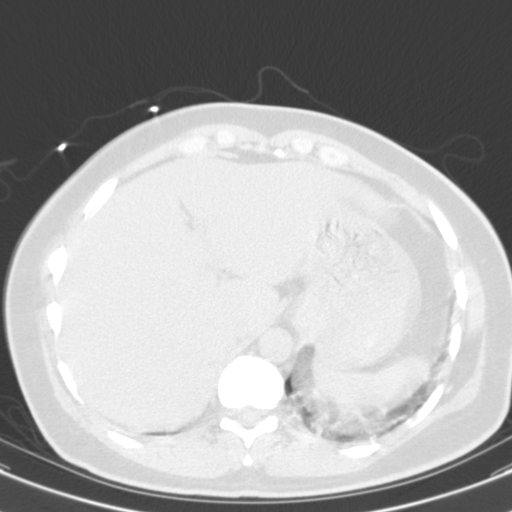
[im 67/352  lung]
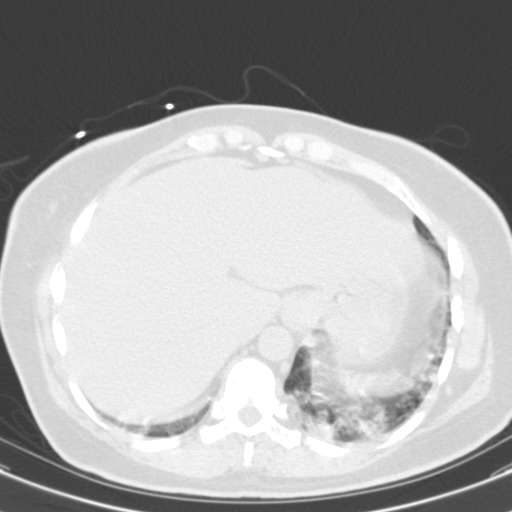
[im 84/352  lung]
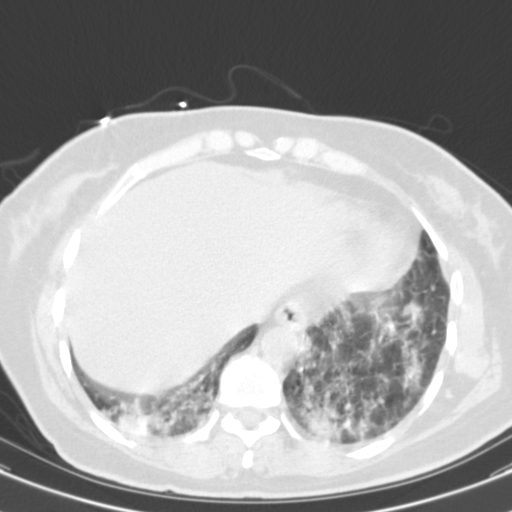
[im 118/352  mediastinal]
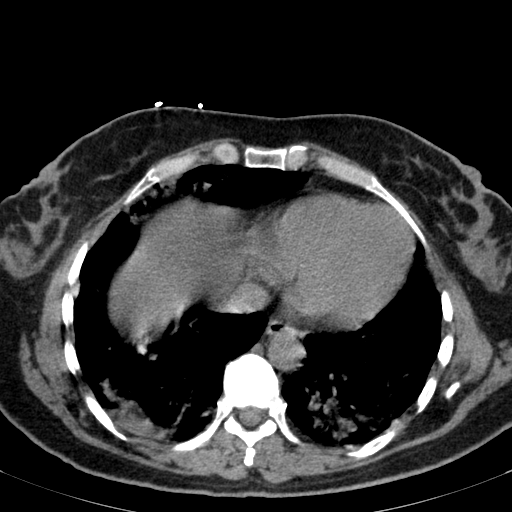
[im 118/352  lung]
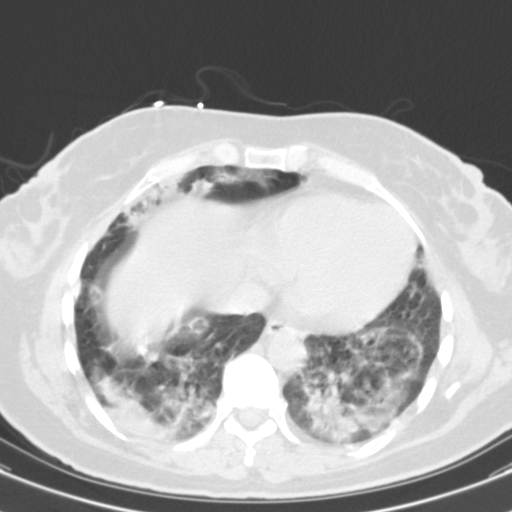
[im 134/352  lung]
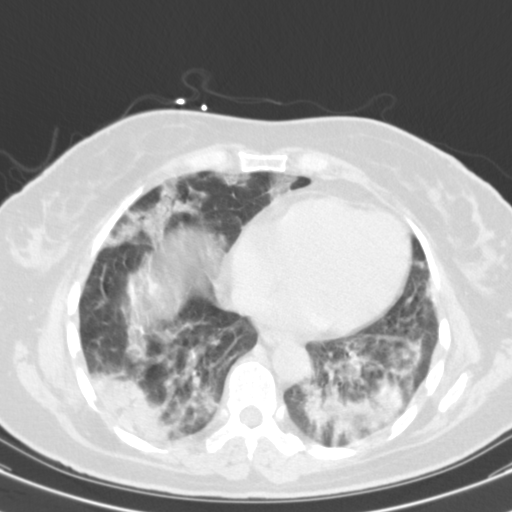
[im 163/352  lung]
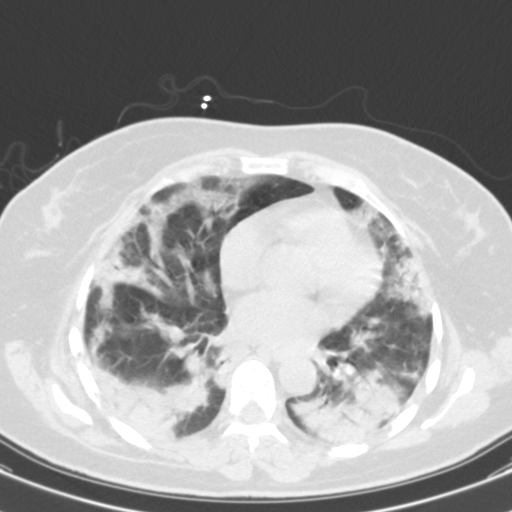
[im 168/352  lung]
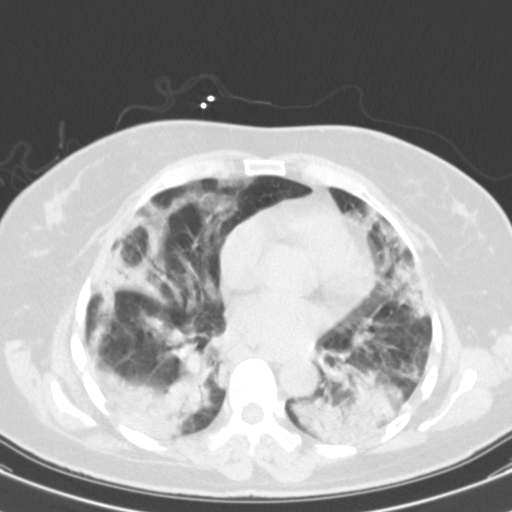
[im 184/352  mediastinal]
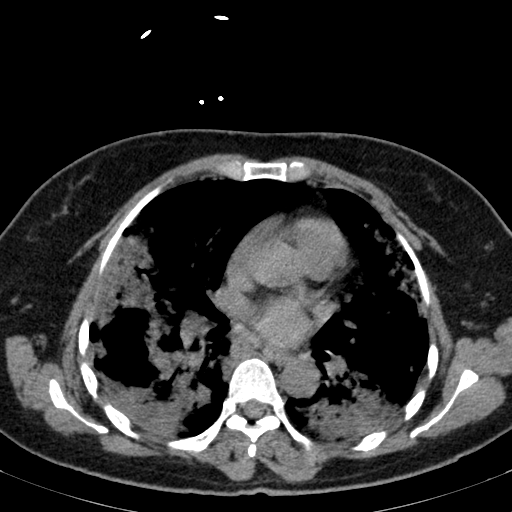
[im 184/352  lung]
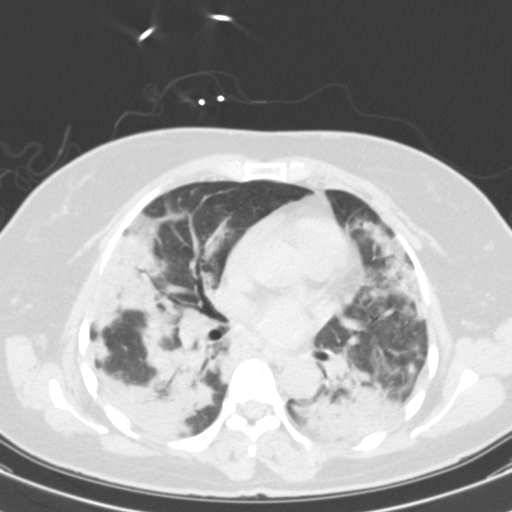
[im 218/352  lung]
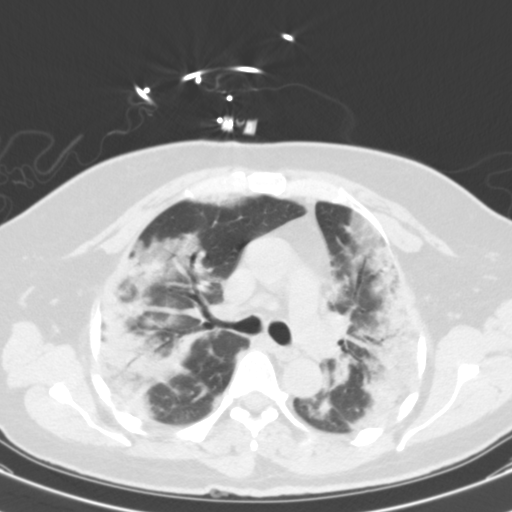
[im 235/352  lung]
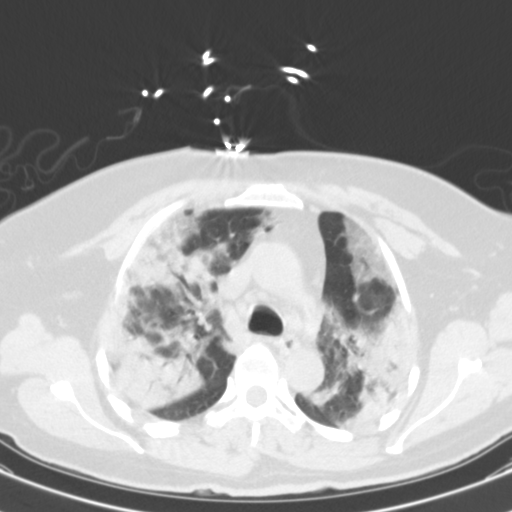
[im 268/352  lung]
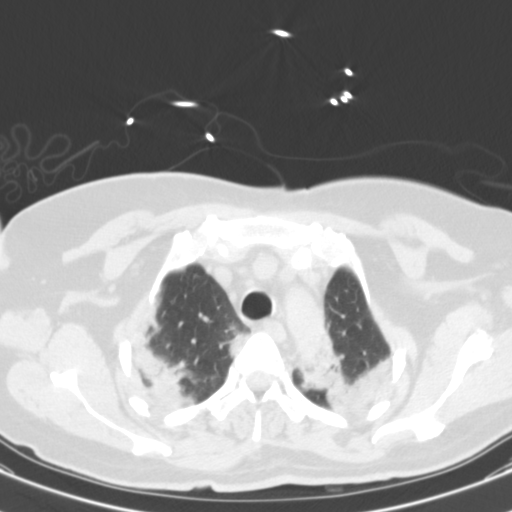
[im 285/352  mediastinal]
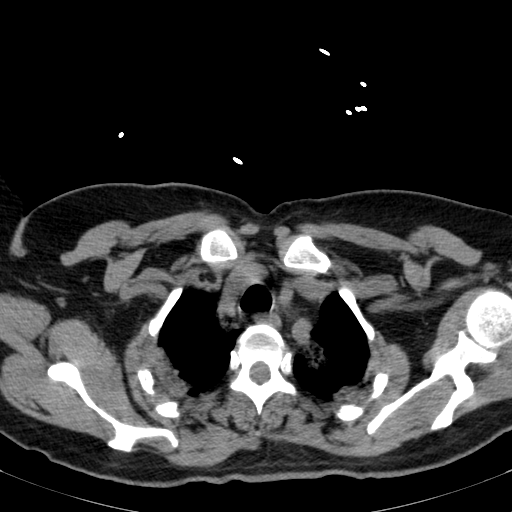
[im 285/352  lung]
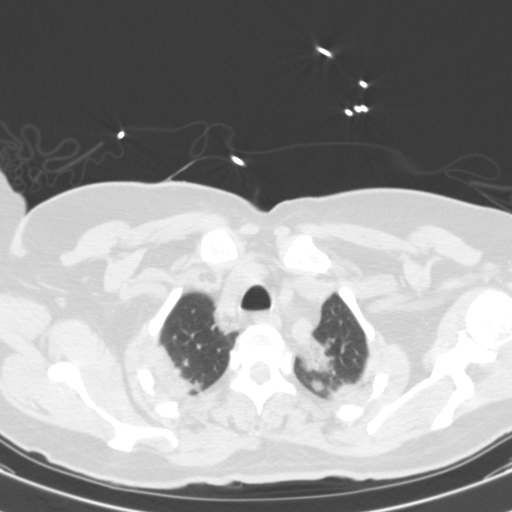
[im 301/352  lung]
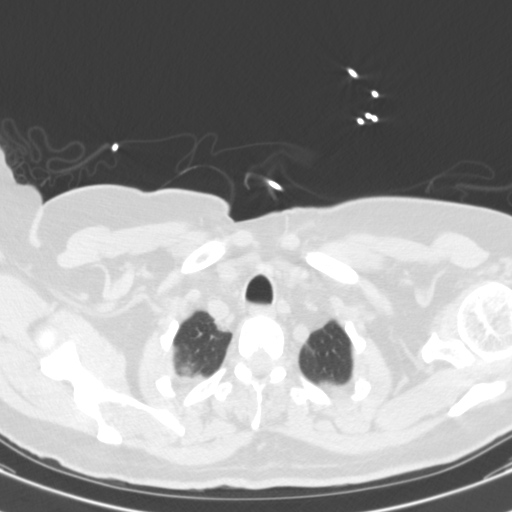
[im 335/352  lung]
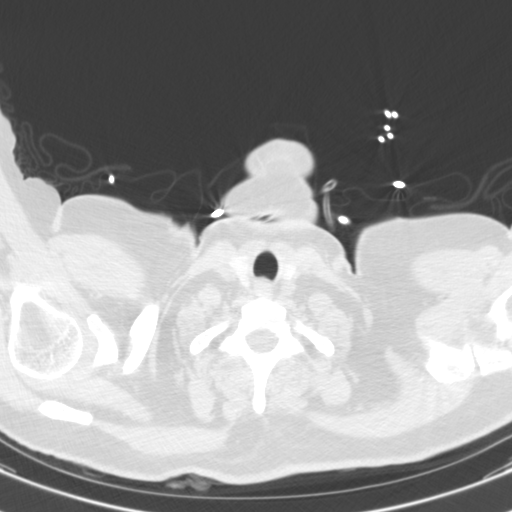

[15 of 31 positions shown; findings below may reference images not displayed]

FINDINGS: Cardiovascular: Somewhat limited due to lack of IV contrast. No
significant atherosclerotic calcifications are seen. No aneurysmal
dilatation or dissection is noted. No cardiac enlargement is seen.
At the confluence of the right internal jugular vein and right
subclavian vein there is a rounded lipomatous lesion identified
which likely sits adjacent to the confluence as opposed to actually
being within the vein.

Mediastinum/Nodes: Thoracic inlet is within normal limits. Few
scattered small reactive lymph nodes are identified within the hila
and mediastinum. The esophagus is within normal limits.

Lungs/Pleura: Lungs again demonstrate multifocal confluent
infiltrates throughout both lungs similar to that seen on recent
chest x-ray consistent with the given clinical history of DKRHR-AP
positivity. No sizable effusion is noted. No pneumothorax is seen.
The area of lucency noted on recent chest x-ray represents an area
of spared lung as opposed to a true cavitary lesion.

Upper Abdomen: Visualized upper abdomen is within normal limits.

Musculoskeletal: No chest wall mass or suspicious bone lesions
identified.
IMPRESSION: Multifocal confluent infiltrates throughout both lungs consistent
with the patient's given clinical history of DKRHR-AP positivity.

The area of lucency seen on recent chest x-ray represents an area of
spared lung on the right not a true cavitary lesion.

No other focal abnormality is noted.
